# Patient Record
Sex: Male | Born: 1965 | Race: White | Hispanic: No | Marital: Married | Smoking: Current every day smoker
Health system: Southern US, Community
[De-identification: ages and names within clinical notes are randomized; demographics above are authoritative.]

## PROBLEM LIST (undated history)

## (undated) DIAGNOSIS — Z87442 Personal history of urinary calculi: Secondary | ICD-10-CM

## (undated) DIAGNOSIS — K219 Gastro-esophageal reflux disease without esophagitis: Secondary | ICD-10-CM

## (undated) DIAGNOSIS — Q631 Lobulated, fused and horseshoe kidney: Secondary | ICD-10-CM

## (undated) DIAGNOSIS — R519 Headache, unspecified: Secondary | ICD-10-CM

## (undated) HISTORY — PX: COLONOSCOPY: SHX174

## (undated) HISTORY — PX: LITHOTRIPSY: SUR834

---

## 1998-11-29 ENCOUNTER — Encounter: Payer: Self-pay | Admitting: Emergency Medicine

## 1998-11-29 ENCOUNTER — Emergency Department (HOSPITAL_COMMUNITY): Admission: EM | Admit: 1998-11-29 | Discharge: 1998-11-29 | Payer: Self-pay | Admitting: Emergency Medicine

## 2000-01-06 ENCOUNTER — Emergency Department (HOSPITAL_COMMUNITY): Admission: EM | Admit: 2000-01-06 | Discharge: 2000-01-06 | Payer: Self-pay | Admitting: Emergency Medicine

## 2001-12-30 ENCOUNTER — Emergency Department (HOSPITAL_COMMUNITY): Admission: EM | Admit: 2001-12-30 | Discharge: 2001-12-31 | Payer: Self-pay | Admitting: Emergency Medicine

## 2001-12-30 ENCOUNTER — Encounter: Payer: Self-pay | Admitting: Emergency Medicine

## 2004-12-18 ENCOUNTER — Emergency Department (HOSPITAL_COMMUNITY): Admission: EM | Admit: 2004-12-18 | Discharge: 2004-12-18 | Payer: Self-pay | Admitting: Emergency Medicine

## 2006-03-24 ENCOUNTER — Emergency Department (HOSPITAL_COMMUNITY): Admission: EM | Admit: 2006-03-24 | Discharge: 2006-03-24 | Payer: Self-pay | Admitting: Emergency Medicine

## 2006-06-13 ENCOUNTER — Emergency Department (HOSPITAL_COMMUNITY): Admission: EM | Admit: 2006-06-13 | Discharge: 2006-06-13 | Payer: Self-pay | Admitting: Emergency Medicine

## 2006-07-11 IMAGING — CT CT PELVIS W/O CM
1 series · 15 of 32 positions shown, 19 images · non-contrast
Comparison: 12/30/01.

CLINICAL DATA: Right flank pain.

[Series 2: stone_wo 5.0 b40f st · axial · 0.72mm/px · z∈[-452,-100]mm · 15 of 99 slices shown, 19 images]
[im 7/99  soft-tissue]
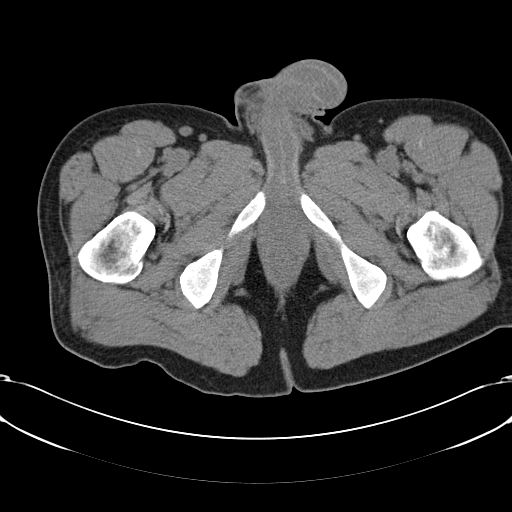
[im 7/99  bone]
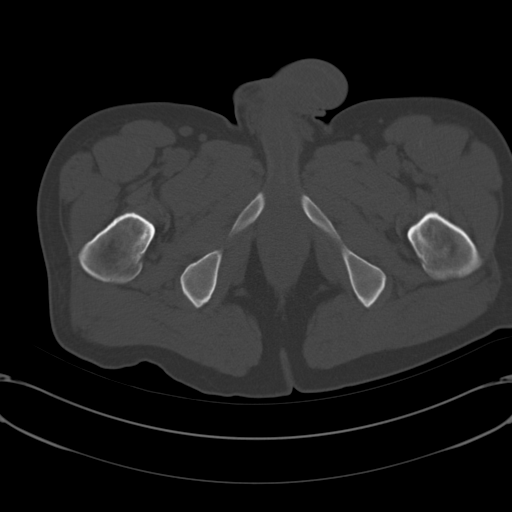
[im 13/99  soft-tissue]
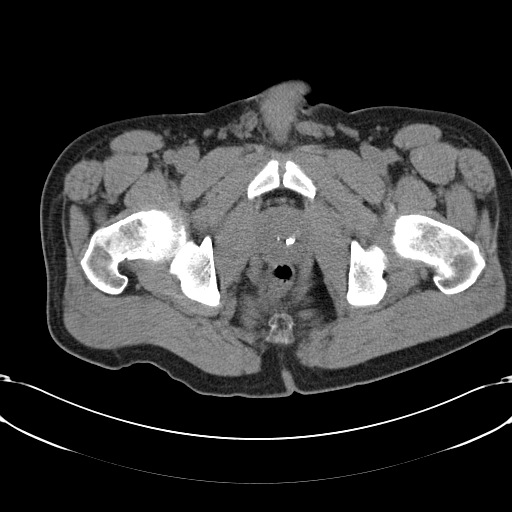
[im 19/99  soft-tissue]
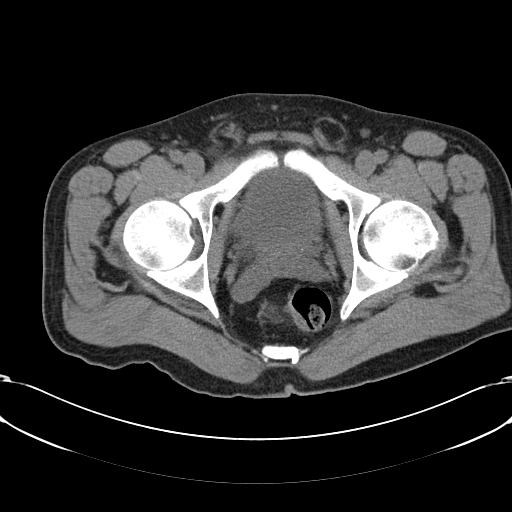
[im 29/99  soft-tissue]
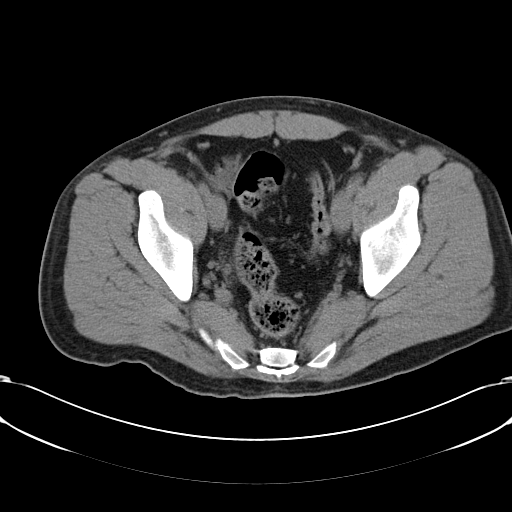
[im 35/99  soft-tissue]
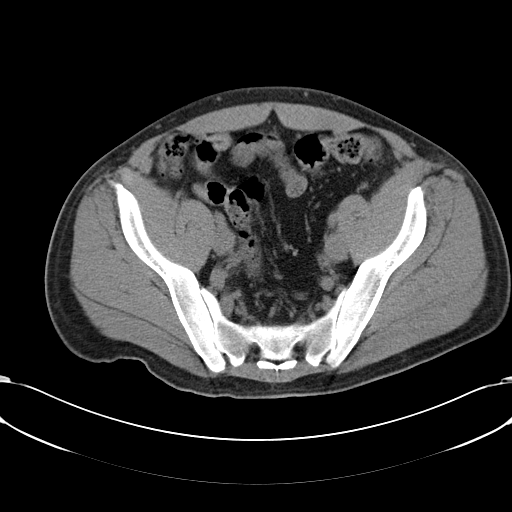
[im 42/99  soft-tissue]
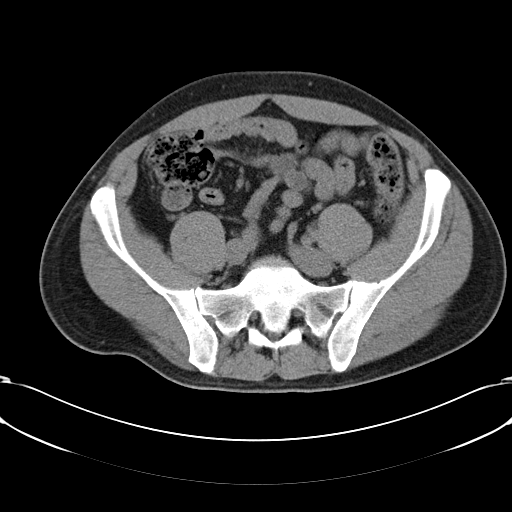
[im 51/99  soft-tissue]
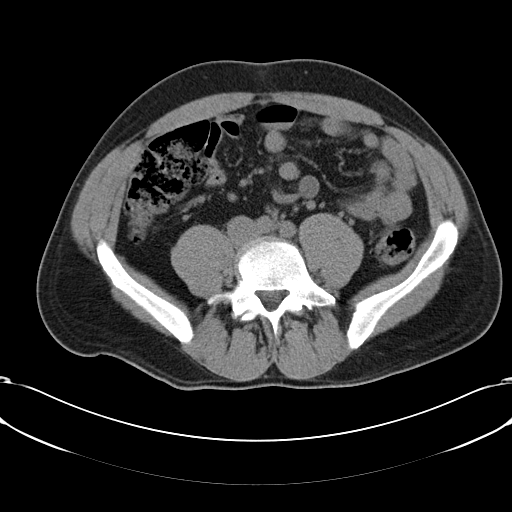
[im 57/99  soft-tissue]
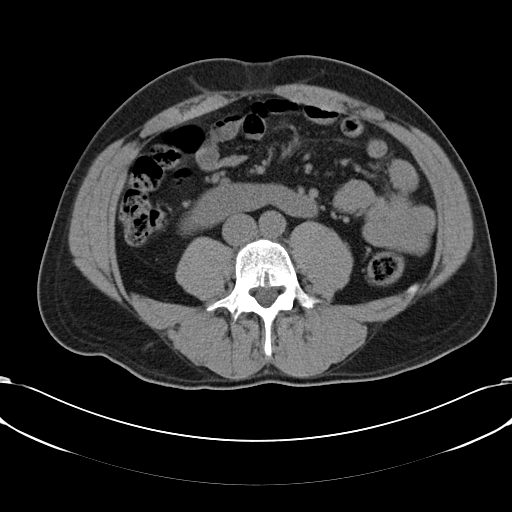
[im 64/99  soft-tissue]
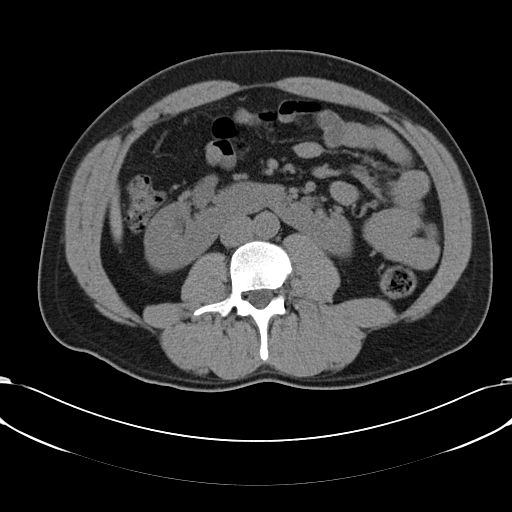
[im 64/99  bone]
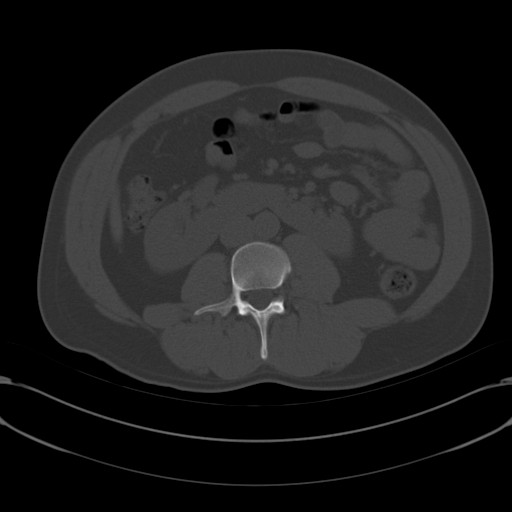
[im 70/99  soft-tissue]
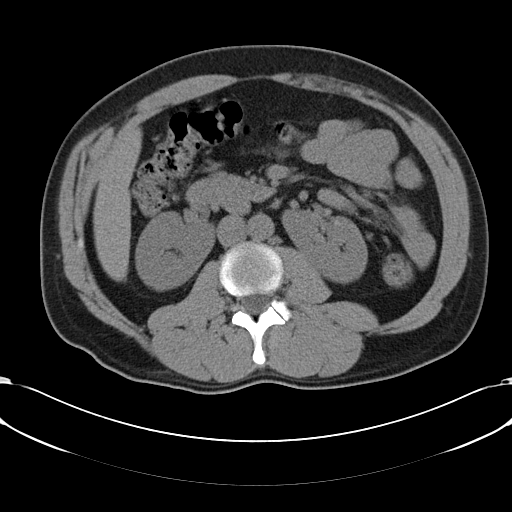
[im 80/99  soft-tissue]
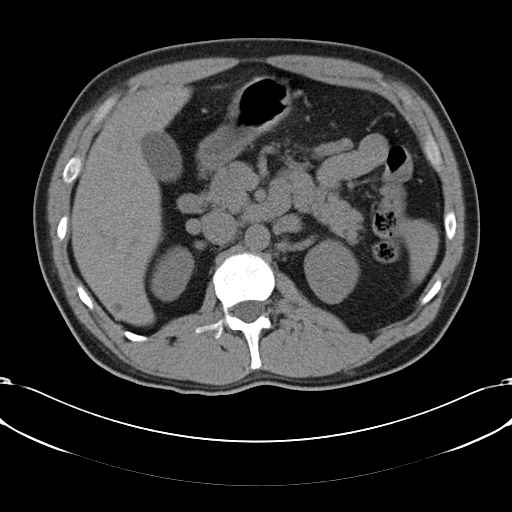
[im 86/99  soft-tissue]
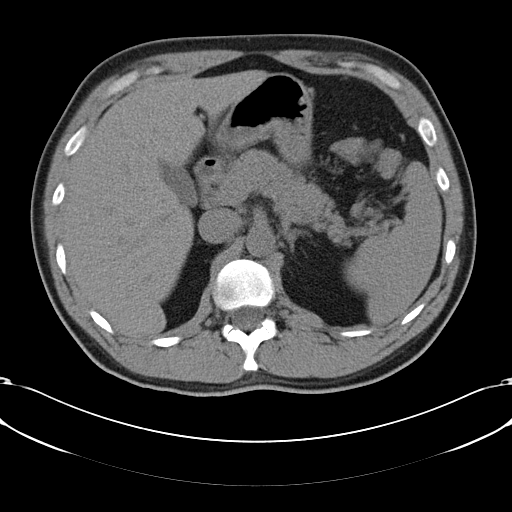
[im 86/99  lung]
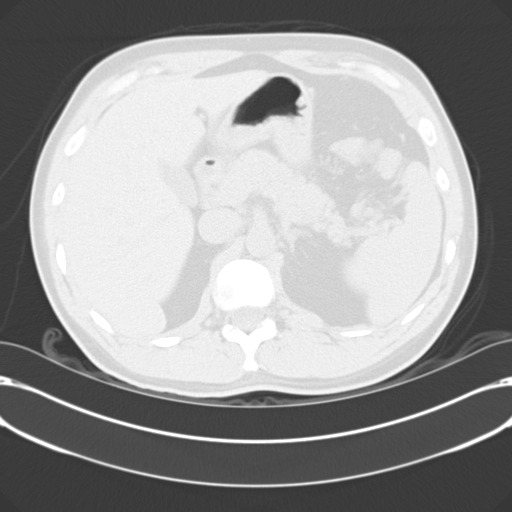
[im 89/99  lung]
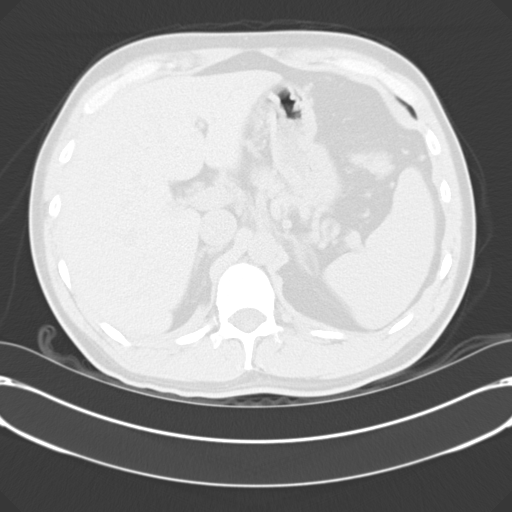
[im 92/99  soft-tissue]
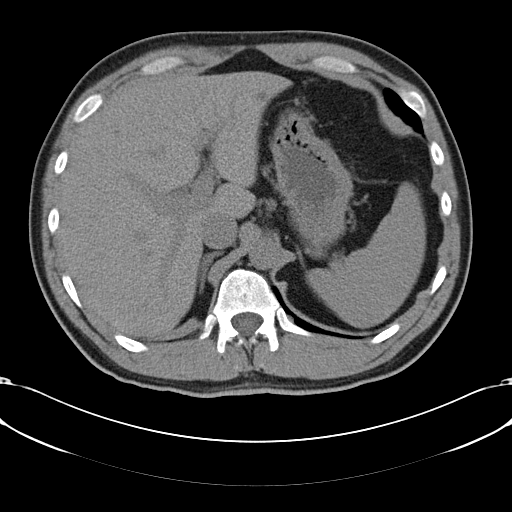
[im 92/99  lung]
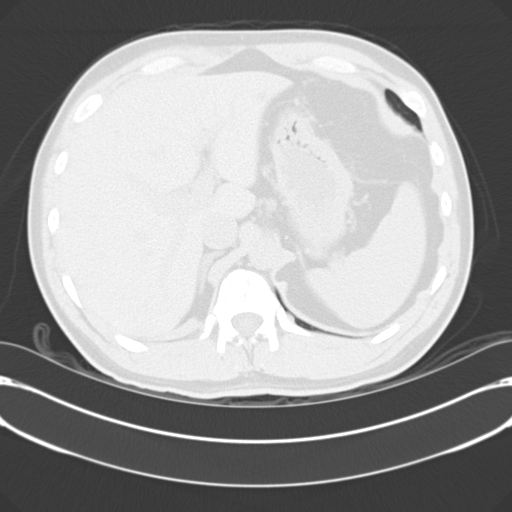
[im 95/99  lung]
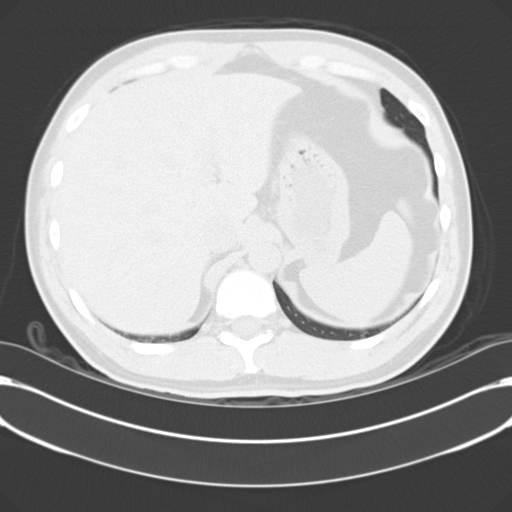

[15 of 32 positions shown; findings below may reference images not displayed]

CT ABDOMEN WITHOUT CONTRAST:
Small low density structures within the liver suggestive of small cysts (inferior right lobe and lateral segment, left lobe).  Unenhanced images of the spleen, adrenal glands, pancreas, gallbladder, and bowel unremarkable.  Scoliosis.  No abdominal aortic aneurysm.  
Horseshoe kidney.  iny bilateral renal calculi.  Mild fullness, right collecting system.  Please see below.
IMPRESSION: Horseshoe kidney with multiple tiny renal calculi.   Mld fullness, right collecting system.  Please see below.
Low density structures in the liver, suggestive of small renal cysts.
CT PELVIS WITHOUT CONTRAST:
Within the distal right ureter, there is a 4.6 mm stone.  Just proximal to this region, there is a tiny renal calculi.  A stone also appears to have been passed and is located within the dependent portion of the bladder, measuring 3.7 mm. There may be a tiny non-obstructing stone along the distal left ureter.  Prostate gland is slightly prominent in size, containing central calcifications.  Mild degenerative changes of lumbar spine.
IMPRESSION: 1.  Distal right ureteral calculi, measuring up to 4.6 mm.  There may be a tiny non-obstructing stone of the distal left ureter.  
2.  Prostate gland is minimally prominent in size containing central calcifications.

## 2006-12-08 ENCOUNTER — Emergency Department (HOSPITAL_COMMUNITY): Admission: EM | Admit: 2006-12-08 | Discharge: 2006-12-08 | Payer: Self-pay | Admitting: Emergency Medicine

## 2019-06-28 DIAGNOSIS — R1084 Generalized abdominal pain: Secondary | ICD-10-CM | POA: Diagnosis not present

## 2019-06-28 DIAGNOSIS — K625 Hemorrhage of anus and rectum: Secondary | ICD-10-CM | POA: Diagnosis not present

## 2019-07-05 DIAGNOSIS — K625 Hemorrhage of anus and rectum: Secondary | ICD-10-CM | POA: Diagnosis not present

## 2019-08-11 DIAGNOSIS — Z1159 Encounter for screening for other viral diseases: Secondary | ICD-10-CM | POA: Diagnosis not present

## 2019-08-16 DIAGNOSIS — K625 Hemorrhage of anus and rectum: Secondary | ICD-10-CM | POA: Diagnosis not present

## 2019-08-16 DIAGNOSIS — K621 Rectal polyp: Secondary | ICD-10-CM | POA: Diagnosis not present

## 2019-08-16 DIAGNOSIS — K635 Polyp of colon: Secondary | ICD-10-CM | POA: Diagnosis not present

## 2019-08-16 DIAGNOSIS — K6289 Other specified diseases of anus and rectum: Secondary | ICD-10-CM | POA: Diagnosis not present

## 2019-08-16 DIAGNOSIS — D123 Benign neoplasm of transverse colon: Secondary | ICD-10-CM | POA: Diagnosis not present

## 2019-08-16 DIAGNOSIS — K529 Noninfective gastroenteritis and colitis, unspecified: Secondary | ICD-10-CM | POA: Diagnosis not present

## 2019-08-24 DIAGNOSIS — K625 Hemorrhage of anus and rectum: Secondary | ICD-10-CM | POA: Diagnosis not present

## 2019-08-26 DIAGNOSIS — K625 Hemorrhage of anus and rectum: Secondary | ICD-10-CM | POA: Diagnosis not present

## 2020-04-06 DIAGNOSIS — F1120 Opioid dependence, uncomplicated: Secondary | ICD-10-CM | POA: Diagnosis not present

## 2020-05-18 DIAGNOSIS — F1120 Opioid dependence, uncomplicated: Secondary | ICD-10-CM | POA: Diagnosis not present

## 2020-06-05 DIAGNOSIS — L720 Epidermal cyst: Secondary | ICD-10-CM | POA: Diagnosis not present

## 2020-06-21 DIAGNOSIS — F1120 Opioid dependence, uncomplicated: Secondary | ICD-10-CM | POA: Diagnosis not present

## 2020-07-13 DIAGNOSIS — F1120 Opioid dependence, uncomplicated: Secondary | ICD-10-CM | POA: Diagnosis not present

## 2020-09-07 DIAGNOSIS — F1120 Opioid dependence, uncomplicated: Secondary | ICD-10-CM | POA: Diagnosis not present

## 2020-09-28 DIAGNOSIS — F1120 Opioid dependence, uncomplicated: Secondary | ICD-10-CM | POA: Diagnosis not present

## 2021-08-31 NOTE — Progress Notes (Signed)
Sent message, via epic in basket, requesting orders in epic from surgeon.  

## 2021-09-13 NOTE — Patient Instructions (Addendum)
DUE TO COVID-19 ONLY ONE VISITOR IS ALLOWED TO COME WITH YOU AND STAY IN THE WAITING ROOM ONLY DURING PRE OP AND PROCEDURE.   **NO VISITORS ARE ALLOWED IN THE SHORT STAY AREA OR RECOVERY ROOM!!**  IF YOU WILL BE ADMITTED INTO THE HOSPITAL YOU ARE ALLOWED ONLY TWO SUPPORT PEOPLE DURING VISITATION HOURS ONLY (7 AM -8PM)   The support person(s) must pass our screening, gel in and out, and wear a mask at all times, including in the patients room. Patients must also wear a mask when staff or their support person are in the room. Visitors GUEST BADGE MUST BE WORN VISIBLY  One adult visitor may remain with you overnight and MUST be in the room by 8 P.M.  No visitors under the age of 51. Any visitor under the age of 79 must be accompanied by an adult.        Your procedure is scheduled on: 09/21/21   Report to [facility] Main Entrance    Report to admitting at : 8:15 AM   Call this number if you have problems the morning of surgery [phone]   Do not eat food :After Midnight.   May have liquids until: 7:30 AM    day of surgery  CLEAR LIQUID DIET  Foods Allowed                                                                     Foods Excluded  Water, Black Coffee and tea, regular and decaf                             liquids that you cannot  Plain Jell-O in any flavor  (No red)                                           see through such as: Fruit ices (not with fruit pulp)                                     milk, soups, orange juice              Iced Popsicles (No red)                                    All solid food                                   Apple juices Sports drinks like Gatorade (No red) Lightly seasoned clear broth or consume(fat free) Sugar Sample Menu Breakfast                                Lunch  Supper Cranberry juice                    Beef broth                            Chicken broth Jell-O                                      Grape juice                           Apple juice Coffee or tea                        Jell-O                                      Popsicle                                                Coffee or tea                        Coffee or tea   FOLLOW BOWEL PREP AND ANY ADDITIONAL PRE OP INSTRUCTIONS YOU RECEIVED FROM YOUR SURGEON'S OFFICE!!!   Oral Hygiene is also important to reduce your risk of infection.                                    Remember - BRUSH YOUR TEETH THE MORNING OF SURGERY WITH YOUR REGULAR TOOTHPASTE   Do NOT smoke after Midnight   Take these medicines the morning of surgery with A SIP OF WATER: N/A  DO NOT TAKE ANY ORAL DIABETIC MEDICATIONS DAY OF YOUR SURGERY                              You may not have any metal on your body including hair pins, jewelry, and body piercing             Do not wear  lotions, powders, perfumes/cologne, or deodorant              Men may shave face and neck.   Do not bring valuables to the hospital. [facility] IS NOT             RESPONSIBLE   FOR VALUABLES.   Contacts, dentures or bridgework may not be worn into surgery.   Bring small overnight bag day of surgery.    Patients discharged on the day of surgery will not be allowed to drive home.  Someone needs to stay with you for the first 24 hours after anesthesia.   Special Instructions: Bring a copy of your healthcare power of attorney and living will documents         the day of surgery if you haven't scanned them before.              Please read over the following fact sheets you were given: IF  YOU HAVE QUESTIONS ABOUT YOUR PRE-OP INSTRUCTIONS PLEASE CALL [phone]     [facility] - Preparing for Surgery Before surgery, you can play an important role.  Because skin is not sterile, your skin needs to be as free of germs as possible.  You can reduce the number of germs on your skin by washing with CHG (chlorahexidine gluconate) soap before surgery.  CHG is an antiseptic  cleaner which kills germs and bonds with the skin to continue killing germs even after washing. Please DO NOT use if you have an allergy to CHG or antibacterial soaps.  If your skin becomes reddened/irritated stop using the CHG and inform your nurse when you arrive at Short Stay. Do not shave (including legs and underarms) for at least 48 hours prior to the first CHG shower.  You may shave your face/neck. Please follow these instructions carefully:  1.  Shower with CHG Soap the night before surgery and the  morning of Surgery.  2.  If you choose to wash your hair, wash your hair first as usual with your  normal  shampoo.  3.  After you shampoo, rinse your hair and body thoroughly to remove the  shampoo.                           4.  Use CHG as you would any other liquid soap.  You can apply chg directly  to the skin and wash                       Gently with a scrungie or clean washcloth.  5.  Apply the CHG Soap to your body ONLY FROM THE NECK DOWN.   Do not use on face/ open                           Wound or open sores. Avoid contact with eyes, ears mouth and genitals (private parts).                       Wash face,  Genitals (private parts) with your normal soap.             6.  Wash thoroughly, paying special attention to the area where your surgery  will be performed.  7.  Thoroughly rinse your body with warm water from the neck down.  8.  DO NOT shower/wash with your normal soap after using and rinsing off  the CHG Soap.                9.  Pat yourself dry with a clean towel.            10.  Wear clean pajamas.            11.  Place clean sheets on your bed the night of your first shower and do not  sleep with pets. Day of Surgery : Do not apply any lotions/deodorants the morning of surgery.  Please wear clean clothes to the hospital/surgery center.  FAILURE TO FOLLOW THESE INSTRUCTIONS MAY RESULT IN THE CANCELLATION OF YOUR SURGERY PATIENT  SIGNATURE_________________________________  NURSE SIGNATURE__________________________________  ________________________________________________________________________

## 2021-09-14 ENCOUNTER — Other Ambulatory Visit: Payer: Self-pay

## 2021-09-14 ENCOUNTER — Encounter (HOSPITAL_COMMUNITY): Payer: Self-pay

## 2021-09-14 ENCOUNTER — Encounter (HOSPITAL_COMMUNITY)
Admission: RE | Admit: 2021-09-14 | Discharge: 2021-09-14 | Disposition: A | Discharge status: Home / Self Care | Payer: 59 | Source: Ambulatory Visit | Attending: Surgery | Admitting: Surgery

## 2021-09-14 VITALS — BP 154/103 | HR 89 | Temp 97.8°F | Ht 70.0 in | Wt 215.0 lb

## 2021-09-14 DIAGNOSIS — Z01818 Encounter for other preprocedural examination: Secondary | ICD-10-CM

## 2021-09-14 DIAGNOSIS — Z01812 Encounter for preprocedural laboratory examination: Secondary | ICD-10-CM | POA: Insufficient documentation

## 2021-09-14 HISTORY — DX: Personal history of urinary calculi: Z87.442

## 2021-09-14 LAB — CBC
HCT: 48.9 % (ref 39.0–52.0)
Hemoglobin: 16.1 g/dL (ref 13.0–17.0)
MCH: 30.2 pg (ref 26.0–34.0)
MCHC: 32.9 g/dL (ref 30.0–36.0)
MCV: 91.7 fL (ref 80.0–100.0)
Platelets: 251 10*3/uL (ref 150–400)
RBC: 5.33 MIL/uL (ref 4.22–5.81)
RDW: 13 % (ref 11.5–15.5)
WBC: 7.7 10*3/uL (ref 4.0–10.5)
nRBC: 0 % (ref 0.0–0.2)

## 2021-09-14 NOTE — Progress Notes (Signed)
COVID Vaccine Completed: NO Date COVID Vaccine completed: COVID vaccine manufacturer: Pfizer    [employer]  COVID Test: N/A Bowel prep reminder: N/A  PCP - NO PCP Cardiologist -   Chest x-ray -  EKG -  Stress Test -  ECHO -  Cardiac Cath -  Pacemaker/ICD device last checked:  Sleep Study -  CPAP -   Fasting Blood Sugar -  Checks Blood Sugar _____ times a day  Blood Thinner Instructions: Aspirin Instructions: Last Dose:  Anesthesia review:   Patient denies shortness of breath, fever, cough and chest pain at PAT appointment   Patient verbalized understanding of instructions that were given to them at the PAT appointment. Patient was also instructed that they will need to review over the PAT instructions again at home before surgery.

## 2021-09-20 NOTE — H&P (Signed)
MRN: [id number] DOB: June 19, 1966   Chief Complaint: left inguinal and umbilical Hernia   History of Present Illness:.   Travis Wilkins has had a left inguinal hernia for several months. He has managed to with a hernia belt. He works at [employer] and does a lot of heavy lifting. We talked about various devices to support that. He also has a small umbilical hernia.  Informed consent was obtained regarding robotic as well as open left inguinal hernia repair  Review of Systems: See HPI as well for other ROS.  ROS   Medical History: Past Medical History:  Diagnosis Date   GERD (gastroesophageal reflux disease)   Substance abuse (CMS-HCC)     Past Surgical History:  Procedure Laterality Date   kidney stone surgery    No Known Allergies  No current outpatient medications on file prior to visit.   No current facility-administered medications on file prior to visit.   History reviewed. No pertinent family history.   Social History   Tobacco Use  Smoking Status Never  Smokeless Tobacco Never    Social History   Socioeconomic History   Marital status: Unknown  Tobacco Use   Smoking status: Never   Smokeless tobacco: Never  Vaping Use   Vaping Use: Never used  Substance and Sexual Activity   Alcohol use: Yes   Drug use: Never   Objective:   Vitals:  BP: 122/80  Pulse: 104  Temp: 37.1 C (98.7 F)  SpO2: 98%  Weight: 97.7 kg (215 lb 6.4 oz)  Height: 177.8 cm (5\' 10" )   Body mass index is 30.91 kg/m.  Physical Exam General: Well-developed white male no acute distress HEENT : Unremarkable Chest: Clear Heart: Sinus rhythm Breast: Not examined Abdomen: Umbilical outie which is small but reducible GU Left inguinal hernia reducible Rectal Not done Extremities FROM Neuro Alert and oriented x 3  Labs, Imaging and Diagnostic Testing: None to reviewa  Assessment and Plan:  Diagnoses and all orders for this visit:  Left inguinal hernia and small  umbilical hernia    I have described robotic left inguinal hernia repair with him probably with the umbilical hernia at the same time. Because he has had a history of substance abuse in the past he does not want any pain medications so we will try to get him through this with a regional block with bupivacaine at the time of his surgery and with ibuprofen.  Arjun Hard , MD

## 2021-09-21 ENCOUNTER — Encounter (HOSPITAL_COMMUNITY)
Admission: RE | Disposition: A | Discharge status: Home / Self Care | Payer: Self-pay | Source: Ambulatory Visit | Attending: Surgery

## 2021-09-21 ENCOUNTER — Other Ambulatory Visit: Payer: Self-pay

## 2021-09-21 ENCOUNTER — Ambulatory Visit (HOSPITAL_COMMUNITY): Payer: 59

## 2021-09-21 ENCOUNTER — Ambulatory Visit (HOSPITAL_BASED_OUTPATIENT_CLINIC_OR_DEPARTMENT_OTHER): Payer: 59 | Admitting: Certified Registered Nurse Anesthetist

## 2021-09-21 ENCOUNTER — Encounter (HOSPITAL_COMMUNITY): Payer: Self-pay | Admitting: Surgery

## 2021-09-21 ENCOUNTER — Ambulatory Visit (HOSPITAL_COMMUNITY): Payer: 59 | Admitting: Certified Registered Nurse Anesthetist

## 2021-09-21 ENCOUNTER — Ambulatory Visit (HOSPITAL_COMMUNITY)
Admission: RE | Admit: 2021-09-21 | Discharge: 2021-09-21 | Disposition: A | Discharge status: Home / Self Care | Payer: 59 | Source: Ambulatory Visit | Attending: Surgery | Admitting: Surgery

## 2021-09-21 DIAGNOSIS — K4090 Unilateral inguinal hernia, without obstruction or gangrene, not specified as recurrent: Secondary | ICD-10-CM | POA: Diagnosis present

## 2021-09-21 DIAGNOSIS — F17210 Nicotine dependence, cigarettes, uncomplicated: Secondary | ICD-10-CM | POA: Diagnosis not present

## 2021-09-21 DIAGNOSIS — K8020 Calculus of gallbladder without cholecystitis without obstruction: Secondary | ICD-10-CM

## 2021-09-21 DIAGNOSIS — K429 Umbilical hernia without obstruction or gangrene: Secondary | ICD-10-CM | POA: Insufficient documentation

## 2021-09-21 HISTORY — PX: HERNIA REPAIR: SHX51

## 2021-09-21 SURGERY — HERNIORRHAPHY, INGUINAL, ROBOT-ASSISTED, LAPAROSCOPIC
Anesthesia: General | Laterality: Left

## 2021-09-21 MED ORDER — KETOROLAC TROMETHAMINE 30 MG/ML IJ SOLN
INTRAMUSCULAR | Status: DC | PRN
  Administered 2021-09-21: 30 mg via INTRAVENOUS

## 2021-09-21 MED ORDER — LIDOCAINE HCL (CARDIAC) PF 100 MG/5ML IV SOSY
PREFILLED_SYRINGE | INTRAVENOUS | Status: DC | PRN
  Administered 2021-09-21: 100 mg via INTRAVENOUS

## 2021-09-21 MED ORDER — IBUPROFEN 800 MG PO TABS: 800.0000 mg | ORAL_TABLET | Freq: Three times a day (TID) | ORAL | 0 refills | Status: AC | PRN

## 2021-09-21 MED ORDER — OXYCODONE HCL 5 MG/5ML PO SOLN: 5.0000 mg | Freq: Once | ORAL | Status: DC | PRN

## 2021-09-21 MED ORDER — CHLORHEXIDINE GLUCONATE CLOTH 2 % EX PADS: 6.0000 | MEDICATED_PAD | Freq: Once | CUTANEOUS | Status: DC

## 2021-09-21 MED ORDER — ACETAMINOPHEN 10 MG/ML IV SOLN: 1000.0000 mg | Freq: Once | INTRAVENOUS | Status: DC | PRN

## 2021-09-21 MED ORDER — SODIUM CHLORIDE (PF) 0.9 % IJ SOLN
INTRAMUSCULAR | Status: DC | PRN
  Administered 2021-09-21: 10 mL

## 2021-09-21 MED ORDER — CEFAZOLIN SODIUM-DEXTROSE 2-4 GM/100ML-% IV SOLN
2.0000 g | INTRAVENOUS | Status: AC
  Administered 2021-09-21: 2 g via INTRAVENOUS
  Filled 2021-09-21: qty 100

## 2021-09-21 MED ORDER — KETOROLAC TROMETHAMINE 30 MG/ML IJ SOLN
INTRAMUSCULAR | Status: AC
  Filled 2021-09-21: qty 1

## 2021-09-21 MED ORDER — ACETAMINOPHEN 160 MG/5ML PO SOLN: 1000.0000 mg | Freq: Once | ORAL | Status: DC | PRN

## 2021-09-21 MED ORDER — PROPOFOL 10 MG/ML IV BOLUS
INTRAVENOUS | Status: DC | PRN
  Administered 2021-09-21: 160 mg via INTRAVENOUS

## 2021-09-21 MED ORDER — ACETAMINOPHEN 500 MG PO TABS
1000.0000 mg | ORAL_TABLET | ORAL | Status: AC
  Administered 2021-09-21: 1000 mg via ORAL
  Filled 2021-09-21: qty 2

## 2021-09-21 MED ORDER — PHENYLEPHRINE HCL (PRESSORS) 10 MG/ML IV SOLN
INTRAVENOUS | Status: AC
  Filled 2021-09-21: qty 1

## 2021-09-21 MED ORDER — ROCURONIUM BROMIDE 100 MG/10ML IV SOLN
INTRAVENOUS | Status: DC | PRN
  Administered 2021-09-21: 30 mg via INTRAVENOUS
  Administered 2021-09-21: 70 mg via INTRAVENOUS
  Administered 2021-09-21: 20 mg via INTRAVENOUS
  Administered 2021-09-21: 30 mg via INTRAVENOUS
  Administered 2021-09-21: 20 mg via INTRAVENOUS

## 2021-09-21 MED ORDER — LACTATED RINGERS IV SOLN: INTRAVENOUS | Status: DC

## 2021-09-21 MED ORDER — KETAMINE HCL 50 MG/5ML IJ SOSY
PREFILLED_SYRINGE | INTRAMUSCULAR | Status: AC
  Filled 2021-09-21: qty 5

## 2021-09-21 MED ORDER — LIDOCAINE HCL (PF) 2 % IJ SOLN
INTRAMUSCULAR | Status: AC
  Filled 2021-09-21: qty 5

## 2021-09-21 MED ORDER — FENTANYL CITRATE (PF) 100 MCG/2ML IJ SOLN
INTRAMUSCULAR | Status: AC
  Filled 2021-09-21: qty 2

## 2021-09-21 MED ORDER — PROPOFOL 10 MG/ML IV BOLUS
INTRAVENOUS | Status: AC
  Filled 2021-09-21: qty 20

## 2021-09-21 MED ORDER — CHLORHEXIDINE GLUCONATE 0.12 % MT SOLN
15.0000 mL | Freq: Once | OROMUCOSAL | Status: AC
  Administered 2021-09-21: 15 mL via OROMUCOSAL

## 2021-09-21 MED ORDER — ONDANSETRON HCL 4 MG/2ML IJ SOLN
INTRAMUSCULAR | Status: AC
  Filled 2021-09-21: qty 2

## 2021-09-21 MED ORDER — ONDANSETRON HCL 4 MG/2ML IJ SOLN
INTRAMUSCULAR | Status: DC | PRN
  Administered 2021-09-21: 4 mg via INTRAVENOUS

## 2021-09-21 MED ORDER — SODIUM CHLORIDE (PF) 0.9 % IJ SOLN
INTRAMUSCULAR | Status: AC
  Filled 2021-09-21: qty 10

## 2021-09-21 MED ORDER — BUPIVACAINE LIPOSOME 1.3 % IJ SUSP
INTRAMUSCULAR | Status: DC | PRN
Start: 2021-09-21 — End: 2021-09-21
  Administered 2021-09-21: 20 mL

## 2021-09-21 MED ORDER — ORAL CARE MOUTH RINSE: 15.0000 mL | Freq: Once | OROMUCOSAL | Status: AC

## 2021-09-21 MED ORDER — MIDAZOLAM HCL 2 MG/2ML IJ SOLN
INTRAMUSCULAR | Status: AC
  Filled 2021-09-21: qty 2

## 2021-09-21 MED ORDER — ACETAMINOPHEN 500 MG PO TABS: 1000.0000 mg | ORAL_TABLET | Freq: Once | ORAL | Status: DC | PRN

## 2021-09-21 MED ORDER — ROCURONIUM BROMIDE 10 MG/ML (PF) SYRINGE
PREFILLED_SYRINGE | INTRAVENOUS | Status: AC
  Filled 2021-09-21: qty 10

## 2021-09-21 MED ORDER — DEXAMETHASONE SODIUM PHOSPHATE 10 MG/ML IJ SOLN
INTRAMUSCULAR | Status: DC | PRN
  Administered 2021-09-21: 10 mg via INTRAVENOUS

## 2021-09-21 MED ORDER — BUPIVACAINE LIPOSOME 1.3 % IJ SUSP
20.0000 mL | Freq: Once | INTRAMUSCULAR | Status: DC
  Filled 2021-09-21: qty 20

## 2021-09-21 MED ORDER — KETAMINE HCL 10 MG/ML IJ SOLN
INTRAMUSCULAR | Status: DC | PRN
  Administered 2021-09-21 (×2): 10 mg via INTRAVENOUS
  Administered 2021-09-21: 30 mg via INTRAVENOUS

## 2021-09-21 MED ORDER — FENTANYL CITRATE PF 50 MCG/ML IJ SOSY
25.0000 ug | PREFILLED_SYRINGE | INTRAMUSCULAR | Status: DC | PRN
  Administered 2021-09-21: 50 ug via INTRAVENOUS

## 2021-09-21 MED ORDER — FENTANYL CITRATE (PF) 100 MCG/2ML IJ SOLN
INTRAMUSCULAR | Status: DC | PRN
  Administered 2021-09-21 (×2): 100 ug via INTRAVENOUS

## 2021-09-21 MED ORDER — PHENYLEPHRINE 40 MCG/ML (10ML) SYRINGE FOR IV PUSH (FOR BLOOD PRESSURE SUPPORT)
PREFILLED_SYRINGE | INTRAVENOUS | Status: DC | PRN
  Administered 2021-09-21 (×2): 80 ug via INTRAVENOUS
  Administered 2021-09-21: 40 ug via INTRAVENOUS
  Administered 2021-09-21 (×2): 80 ug via INTRAVENOUS

## 2021-09-21 MED ORDER — MIDAZOLAM HCL 5 MG/5ML IJ SOLN
INTRAMUSCULAR | Status: DC | PRN
  Administered 2021-09-21: 2 mg via INTRAVENOUS

## 2021-09-21 MED ORDER — FENTANYL CITRATE PF 50 MCG/ML IJ SOSY
PREFILLED_SYRINGE | INTRAMUSCULAR | Status: AC
  Filled 2021-09-21: qty 3

## 2021-09-21 MED ORDER — BUPIVACAINE LIPOSOME 1.3 % IJ SUSP
INTRAMUSCULAR | Status: AC
  Filled 2021-09-21: qty 20

## 2021-09-21 MED ORDER — SUGAMMADEX SODIUM 200 MG/2ML IV SOLN
INTRAVENOUS | Status: DC | PRN
  Administered 2021-09-21: 200 mg via INTRAVENOUS

## 2021-09-21 MED ORDER — PHENYLEPHRINE HCL-NACL 20-0.9 MG/250ML-% IV SOLN
INTRAVENOUS | Status: DC | PRN
  Administered 2021-09-21: 40 ug/min via INTRAVENOUS

## 2021-09-21 MED ORDER — OXYCODONE HCL 5 MG PO TABS: 5.0000 mg | ORAL_TABLET | Freq: Once | ORAL | Status: DC | PRN

## 2021-09-21 SURGICAL SUPPLY — 64 items
ADH SKN CLS APL DERMABOND .7 (GAUZE/BANDAGES/DRESSINGS) ×1
APL PRP STRL LF DISP 70% ISPRP (MISCELLANEOUS) ×1
APL SWBSTK 6 STRL LF DISP (MISCELLANEOUS)
APPLICATOR COTTON TIP 6 STRL (MISCELLANEOUS) ×2 IMPLANT
APPLICATOR COTTON TIP 6IN STRL (MISCELLANEOUS)
BAG COUNTER SPONGE SURGICOUNT (BAG) IMPLANT
BAG SPNG CNTER NS LX DISP (BAG)
BLADE SURG 15 STRL LF DISP TIS (BLADE) ×1 IMPLANT
BLADE SURG 15 STRL SS (BLADE) ×2
CANNULA REDUC XI 12-8 STAPL (CANNULA) ×2
CANNULA REDUCER 12-8 DVNC XI (CANNULA) ×1 IMPLANT
CHLORAPREP W/TINT 26 (MISCELLANEOUS) ×2 IMPLANT
COVER MAYO STAND STRL (DRAPES) ×1 IMPLANT
COVER SURGICAL LIGHT HANDLE (MISCELLANEOUS) ×2 IMPLANT
COVER TIP SHEARS 8 DVNC (MISCELLANEOUS) ×1 IMPLANT
COVER TIP SHEARS 8MM DA VINCI (MISCELLANEOUS) ×2
DERMABOND ADVANCED (GAUZE/BANDAGES/DRESSINGS) ×1
DERMABOND ADVANCED .7 DNX12 (GAUZE/BANDAGES/DRESSINGS) IMPLANT
DRAPE ARM DVNC X/XI (DISPOSABLE) ×3 IMPLANT
DRAPE COLUMN DVNC XI (DISPOSABLE) ×1 IMPLANT
DRAPE DA VINCI XI ARM (DISPOSABLE) ×8
DRAPE DA VINCI XI COLUMN (DISPOSABLE) ×2
ELECT REM PT RETURN 15FT ADLT (MISCELLANEOUS) ×2 IMPLANT
GLOVE SURG ENC TEXT LTX SZ8 (GLOVE) ×4 IMPLANT
GOWN STRL REUS W/TWL XL LVL3 (GOWN DISPOSABLE) ×6 IMPLANT
GRASPER SUT TROCAR 14GX15 (MISCELLANEOUS) ×2 IMPLANT
IRRIG SUCT STRYKERFLOW 2 WTIP (MISCELLANEOUS)
IRRIGATION SUCT STRKRFLW 2 WTP (MISCELLANEOUS) ×1 IMPLANT
KIT BASIN OR (CUSTOM PROCEDURE TRAY) ×2 IMPLANT
KIT TURNOVER KIT A (KITS) IMPLANT
MARKER SKIN DUAL TIP RULER LAB (MISCELLANEOUS) ×2 IMPLANT
MESH 3DMAX 4X6 LT LRG (Mesh General) ×1 IMPLANT
NEEDLE HYPO 22GX1.5 SAFETY (NEEDLE) ×2 IMPLANT
NS IRRIG 1000ML POUR BTL (IV SOLUTION) ×1 IMPLANT
OBTURATOR OPTICAL STANDARD 8MM (TROCAR) ×2
OBTURATOR OPTICAL STND 8 DVNC (TROCAR) ×1
OBTURATOR OPTICALSTD 8 DVNC (TROCAR) ×1 IMPLANT
PACK CARDIOVASCULAR III (CUSTOM PROCEDURE TRAY) ×2 IMPLANT
PAD POSITIONING PINK XL (MISCELLANEOUS) ×2 IMPLANT
SCISSORS LAP 5X35 DISP (ENDOMECHANICALS) IMPLANT
SEAL CANN UNIV 5-8 DVNC XI (MISCELLANEOUS) ×2 IMPLANT
SEAL XI 5MM-8MM UNIVERSAL (MISCELLANEOUS) ×6
SOL ANTI FOG 6CC (MISCELLANEOUS) ×1 IMPLANT
SOLUTION ANTI FOG 6CC (MISCELLANEOUS) ×1
SOLUTION ELECTROLUBE (MISCELLANEOUS) ×2 IMPLANT
SPIKE FLUID TRANSFER (MISCELLANEOUS) ×1 IMPLANT
SPONGE T-LAP 18X18 ~~LOC~~+RFID (SPONGE) ×2 IMPLANT
STAPLER CANNULA SEAL DVNC XI (STAPLE) ×1 IMPLANT
STAPLER CANNULA SEAL XI (STAPLE) ×2
SUT MNCRL AB 4-0 PS2 18 (SUTURE) ×4 IMPLANT
SUT V-LOC BARB 180 2/0GR6 GS22 (SUTURE) ×2
SUT VIC AB 2-0 SH 27 (SUTURE) ×2
SUT VIC AB 2-0 SH 27X BRD (SUTURE) ×1 IMPLANT
SUT VICRYL 0 TIES 12 18 (SUTURE) ×2 IMPLANT
SUT VLOC 180 2-0 6IN GS21 (SUTURE) ×1 IMPLANT
SUTURE V-LC BRB 180 2/0GR6GS22 (SUTURE) IMPLANT
SYR 10ML ECCENTRIC (SYRINGE) ×2 IMPLANT
SYR 20ML LL LF (SYRINGE) ×2 IMPLANT
TAPE STRIPS DRAPE STRL (GAUZE/BANDAGES/DRESSINGS) ×2 IMPLANT
TOWEL OR 17X26 10 PK STRL BLUE (TOWEL DISPOSABLE) ×2 IMPLANT
TOWEL OR NON WOVEN STRL DISP B (DISPOSABLE) ×2 IMPLANT
TROCAR ADV FIXATION 12X100MM (TROCAR) IMPLANT
TROCAR BLADELESS OPT 5 100 (ENDOMECHANICALS) ×2 IMPLANT
TUBING INSUFFLATION 10FT LAP (TUBING) ×2 IMPLANT

## 2021-09-21 NOTE — Op Note (Signed)
Travis Wilkins  May 02, 1966   09/21/2021    PCP:  [facility] [place]   Surgeon: Wenda Low, MD, FACS  Asst:  none  Anes:  general  Preop Dx: Left inguinal hernia Postop Dx: Left sliding indirect inguinal hernia  Procedure: Robotic XI LIH repair with Bard 3D Max mesh Location Surgery: [facility] 5 Complications:  none  EBL:   minimal cc  Drains: none  Description of Procedure:  The patient was taken to OR 5 .  After anesthesia was administered and the patient was prepped  with ChloraPrep as well as the scrotum was painted with Betadine and a timeout was performed.  Access to the abdomen was achieved with a 5 mm Optiview through the left upper quadrant.  This was incorporated into my 3 upper abdominal robotic trocars and 2 eights were placed on the left and in the midline slightly to the off and in the right upper quadrant of the 12 mm for mesh exchange and suture.  Robot was docked and camera targeted and we started with a fenestrated bipolar in the left side and the monopolar scissors on the right with the 30 degree angle scope.  The sigmoid colon was adherent to the internal anterior abdominal wall in the vicinity of the of the colon and in portions of the colonic structures were stuck into the hernia sac.  These were taken down with sharp dissections.  I created a flap by going above the defect going medial to lateral and I opened and created flaps in both the parietal and visceral peritoneal layers and carried this down to the hernia sac.  I went down and identified the pubis and then went down to free the sac from the surrounding structures and also to reduce a large lipoma of the cord which was found.  These were brought down.  I went wide and it was well medial and below the pubis.  I sutured up.  Piece of Bard 3D max mesh tacking it medially to the pubis with a 2-0 Vicryl.  I tacked it anteriorly with a 2-0 Vicryl and then it laid out nicely.  The indirect sac  had been stripped off the cord and identified the vas and saw the vessels and those remained intact.  Portion of the sac could have been involved with a slider were somewhat thin and I ended up incising an area to get it off the vas to reduce it better.  Once that was done I was able to then closed the flaps with a running of V-Loc.  There was a little opening down where the hernia sac had been closed with a V-Loc as well.  Colon appeared to be undisturbed and all of this.  Everything went well.  Testicle was in position position and we then repaired the 12 mm port with a single PMI and a 0 Vicryl.  I in the preop moments I had injected the anterior superior iliac spine on the left side with a field block and then injected the trocars with Exparel as well.  Wounds were closed with 4-0 Monocryl and Dermabond.  The patient tolerated the procedure well and was taken to the PACU in stable condition.     Matt B. Daphine Deutscher, MD, [facility], [place] [phone]

## 2021-09-21 NOTE — Transfer of Care (Signed)
Immediate Anesthesia Transfer of Care Note  Patient: Travis Wilkins  Procedure(s) Performed: XI ROBOTIC ASSISTED left INGUINAL HERNIA (Left)  Patient Location: PACU  Anesthesia Type:General  Level of Consciousness: awake, alert  and oriented  Airway & Oxygen Therapy: Patient Spontanous Breathing  Post-op Assessment: Report given to RN and Post -op Vital signs reviewed and stable  Post vital signs: Reviewed and stable  Last Vitals:  Vitals Value Taken Time  BP 134/94 09/21/21 1337  Temp    Pulse 94 09/21/21 1343  Resp 18 09/21/21 1343  SpO2 92 % 09/21/21 1343  Vitals shown include unvalidated device data.  Last Pain:  Vitals:   09/21/21 0911  TempSrc: Oral  PainSc: 0-No pain      Patients Stated Pain Goal: 3 (09/21/21 0911)  Complications: No notable events documented.

## 2021-09-21 NOTE — Anesthesia Preprocedure Evaluation (Signed)
Anesthesia Evaluation  Patient identified by MRN, date of birth, ID band Patient awake    Reviewed: Allergy & Precautions, NPO status , Patient's Chart, lab work & pertinent test results  History of Anesthesia Complications Negative for: history of anesthetic complications  Airway Mallampati: III  TM Distance: >3 FB Neck ROM: Full    Dental  (+) Teeth Intact, Dental Advisory Given   Pulmonary neg shortness of breath, neg COPD, neg recent URI, Current Smoker and Patient abstained from smoking.,    breath sounds clear to auscultation       Cardiovascular negative cardio ROS   Rhythm:Regular     Neuro/Psych negative neurological ROS  negative psych ROS   GI/Hepatic negative GI ROS, Neg liver ROS,   Endo/Other  negative endocrine ROS  Renal/GU negative Renal ROS     Musculoskeletal negative musculoskeletal ROS (+)   Abdominal   Peds  Hematology negative hematology ROS (+)   Anesthesia Other Findings   Reproductive/Obstetrics                             Anesthesia Physical Anesthesia Plan  ASA: 1  Anesthesia Plan: General   Post-op Pain Management: Toradol IV (intra-op)* and Tylenol PO (pre-op)*   Induction: Intravenous  PONV Risk Score and Plan: 1 and Ondansetron and Dexamethasone  Airway Management Planned: Oral ETT  Additional Equipment: None  Intra-op Plan:   Post-operative Plan: Extubation in OR  Informed Consent: I have reviewed the patients History and Physical, chart, labs and discussed the procedure including the risks, benefits and alternatives for the proposed anesthesia with the patient or authorized representative who has indicated his/her understanding and acceptance.     Dental advisory given  Plan Discussed with: CRNA and Anesthesiologist  Anesthesia Plan Comments:         Anesthesia Quick Evaluation

## 2021-09-21 NOTE — Interval H&P Note (Signed)
History and Physical Interval Note:  09/21/2021 9:37 AM  Travis Wilkins  has presented today for surgery, with the diagnosis of left inguinal hernia.  The various methods of treatment have been discussed with the patient and family. After consideration of risks, benefits and other options for treatment, the patient has consented to  Procedure(s): XI ROBOTIC ASSISTED left INGUINAL HERNIA (Left) as a surgical intervention.  The patient's history has been reviewed, patient examined, no change in status, stable for surgery.  I have reviewed the patient's chart and labs.  Questions were answered to the patient's satisfaction.     [name]

## 2021-09-21 NOTE — Anesthesia Procedure Notes (Signed)
Procedure Name: Intubation Date/Time: 09/21/2021 10:06 AM Performed by: [place] Lyana Asbill C, CRNA Pre-anesthesia Checklist: Patient identified, Emergency Drugs available, Suction available and Patient being monitored Patient Re-evaluated:Patient Re-evaluated prior to induction Oxygen Delivery Method: Circle system utilized Preoxygenation: Pre-oxygenation with 100% oxygen Induction Type: IV induction Ventilation: Mask ventilation without difficulty Laryngoscope Size: Mac and 4 Grade View: Grade II Tube type: Oral Tube size: 7.5 mm Number of attempts: 1 Airway Equipment and Method: Stylet and Oral airway Placement Confirmation: ETT inserted through vocal cords under direct vision, positive ETCO2 and breath sounds checked- equal and bilateral Secured at: 22 cm Tube secured with: Tape Dental Injury: Teeth and Oropharynx as per pre-operative assessment

## 2021-09-21 NOTE — Anesthesia Postprocedure Evaluation (Signed)
Anesthesia Post Note  Patient: Travis Wilkins  Procedure(s) Performed: XI ROBOTIC ASSISTED left INGUINAL HERNIA (Left)     Patient location during evaluation: PACU Anesthesia Type: General Level of consciousness: sedated Pain management: pain level controlled Vital Signs Assessment: post-procedure vital signs reviewed and stable Respiratory status: spontaneous breathing and respiratory function stable Cardiovascular status: stable Postop Assessment: no apparent nausea or vomiting Anesthetic complications: no   No notable events documented.  Last Vitals:  Vitals:   09/21/21 1443 09/21/21 1446  BP: (!) 138/99 (!) 134/91  Pulse: 97 92  Resp:    Temp:    SpO2: 96% 98%    Last Pain:  Vitals:   09/21/21 1446  TempSrc:   PainSc: 0-No pain                 Rod Majerus DANIEL

## 2022-01-18 NOTE — Progress Notes (Signed)
Pt. Needs orders for upcoming surgery. ?

## 2022-01-18 NOTE — Patient Instructions (Signed)
DUE TO COVID-19 ONLY TWO VISITORS  (aged 54 and older)  ARE ALLOWED TO COME WITH YOU AND STAY IN THE WAITING ROOM ONLY DURING PRE OP AND PROCEDURE.   **NO VISITORS ARE ALLOWED IN THE SHORT STAY AREA OR RECOVERY ROOM!!**  IF YOU WILL BE ADMITTED INTO THE HOSPITAL YOU ARE ALLOWED ONLY FOUR SUPPORT PEOPLE DURING VISITATION HOURS ONLY (7 AM -8PM)   The support person(s) must pass our screening, gel in and out, and wear a mask at all times, including in the patient's room. Patients must also wear a mask when staff or their support person are in the room. Visitors GUEST BADGE MUST BE WORN VISIBLY  One adult visitor may remain with you overnight and MUST be in the room by 8 P.M.     Your procedure is scheduled on: 01/22/22   Report to [facility] Main Entrance    Report to admitting at: 8:45 AM   Call this number if you have problems the morning of surgery [phone]   Do not eat food :After Midnight.   After Midnight you may have the following liquids until: 8:00 AM DAY OF SURGERY  Water Black Coffee (sugar ok, NO MILK/CREAM OR CREAMERS)  Tea (sugar ok, NO MILK/CREAM OR CREAMERS) regular and decaf                             Plain Jell-O (NO RED)                                           Fruit ices (not with fruit pulp, NO RED)                                     Popsicles (NO RED)                                                                  Juice: apple, WHITE grape, WHITE cranberry Sports drinks like Gatorade (NO RED) Clear broth(vegetable,chicken,beef)   Oral Hygiene is also important to reduce your risk of infection.                                    Remember - BRUSH YOUR TEETH THE MORNING OF SURGERY WITH YOUR REGULAR TOOTHPASTE   Do NOT smoke after Midnight   Take these medicines the morning of surgery with A SIP OF WATER: omeprazole.  DO NOT TAKE ANY ORAL DIABETIC MEDICATIONS DAY OF YOUR SURGERY  Bring CPAP mask and tubing day of surgery.                               You may not have any metal on your body including hair pins, jewelry, and body piercing             Do not wear  lotions, powders, perfumes/cologne, or deodorant  Men may shave face and neck.   Do not bring valuables to the hospital. [place].   Contacts, dentures or bridgework may not be worn into surgery.   Bring small overnight bag day of surgery.   DO NOT [place]. PHARMACY WILL DISPENSE MEDICATIONS LISTED ON YOUR MEDICATION LIST TO YOU DURING YOUR ADMISSION [place]!    Patients discharged on the day of surgery will not be allowed to drive home.  Someone NEEDS to stay with you for the first 24 hours after anesthesia.   Special Instructions: Bring a copy of your healthcare power of attorney and living will documents         the day of surgery if you haven't scanned them before.              Please read over the following fact sheets you were given: IF YOU HAVE QUESTIONS ABOUT YOUR PRE-OP INSTRUCTIONS PLEASE CALL [phone]     [facility] - Preparing for Surgery Before surgery, you can play an important role.  Because skin is not sterile, your skin needs to be as free of germs as possible.  You can reduce the number of germs on your skin by washing with CHG (chlorahexidine gluconate) soap before surgery.  CHG is an antiseptic cleaner which kills germs and bonds with the skin to continue killing germs even after washing. Please DO NOT use if you have an allergy to CHG or antibacterial soaps.  If your skin becomes reddened/irritated stop using the CHG and inform your nurse when you arrive at Short Stay. Do not shave (including legs and underarms) for at least 48 hours prior to the first CHG shower.  You may shave your face/neck. Please follow these instructions carefully:  1.  Shower with CHG Soap the night before surgery and the  morning of Surgery.  2.  If you choose to  wash your hair, wash your hair first as usual with your  normal  shampoo.  3.  After you shampoo, rinse your hair and body thoroughly to remove the  shampoo.                           4.  Use CHG as you would any other liquid soap.  You can apply chg directly  to the skin and wash                       Gently with a scrungie or clean washcloth.  5.  Apply the CHG Soap to your body ONLY FROM THE NECK DOWN.   Do not use on face/ open                           Wound or open sores. Avoid contact with eyes, ears mouth and genitals (private parts).                       Wash face,  Genitals (private parts) with your normal soap.             6.  Wash thoroughly, paying special attention to the area where your surgery  will be performed.  7.  Thoroughly rinse your body with warm water from the neck down.  8.  DO NOT shower/wash with your normal soap after using and rinsing off  the CHG Soap.                9.  Pat yourself dry with a clean towel.            10.  Wear clean pajamas.            11.  Place clean sheets on your bed the night of your first shower and do not  sleep with pets. Day of Surgery : Do not apply any lotions/deodorants the morning of surgery.  Please wear clean clothes to the hospital/surgery center.  FAILURE TO FOLLOW THESE INSTRUCTIONS MAY RESULT IN THE CANCELLATION OF YOUR SURGERY PATIENT SIGNATURE_________________________________  NURSE SIGNATURE__________________________________  ________________________________________________________________________

## 2022-01-20 NOTE — H&P (Signed)
Chief Complaint:  recurrent left inguinal hernia with pain  History of Present Illness:  Travis Wilkins is an 56 y.o. male who had a robotic LIH in February presented with an early recurrent LIH.  I have discussed this with him and the plan is to do laparoscopy and plan for an open left inguinal hernia repair  Past Medical History:  Diagnosis Date   History of kidney stones     Past Surgical History:  Procedure Laterality Date   LITHOTRIPSY      No current facility-administered medications for this encounter.   Current Outpatient Medications  Medication Sig Dispense Refill   diclofenac Sodium (VOLTAREN) 1 % GEL Apply 2 g topically daily as needed (knee pain).     ibuprofen (ADVIL) 200 MG tablet Take 400 mg by mouth every 6 (six) hours as needed for moderate pain or headache.     ibuprofen (ADVIL) 800 MG tablet Take 1 tablet (800 mg total) by mouth every 8 (eight) hours as needed. 30 tablet 0   omeprazole (PRILOSEC OTC) 20 MG tablet Take 20 mg by mouth daily.     Patient has no known allergies. No family history on file. Social History:   reports that he has been smoking cigarettes. He has been smoking an average of .25 packs per day. His smokeless tobacco use includes snuff. He reports current alcohol use. He reports that he does not use drugs.   REVIEW OF SYSTEMS : Negative except for see problem list  Physical Exam:   There were no vitals taken for this visit. There is no height or weight on file to calculate BMI.  Gen:  WDWN WM NAD  Neurological: Alert and oriented to person, place, and time. Motor and sensory function is grossly intact  Head: Normocephalic and atraumatic.  Eyes: Conjunctivae are normal. Pupils are equal, round, and reactive to light. No scleral icterus.  Neck: Normal range of motion. Neck supple. No tracheal deviation or thyromegaly present.  Cardiovascular:  SR without murmurs or gallops.  No carotid bruits Breast:  not examined Respiratory: Effort  normal.  No respiratory distress. No chest wall tenderness. Breath sounds normal.  No wheezes, rales or rhonchi.  Abdomen:  nontender GU:  reducible recurrent left inguinal hernia Musculoskeletal: Normal range of motion. Extremities are nontender. No cyanosis, edema or clubbing noted Lymphadenopathy: No cervical, preauricular, postauricular or axillary adenopathy is present Skin: Skin is warm and dry. No rash noted. No diaphoresis. No erythema. No pallor. Pscyh: Normal mood and affect. Behavior is normal. Judgment and thought content normal.   LABORATORY RESULTS: No results found for this or any previous visit (from the past 48 hour(s)).   RADIOLOGY RESULTS: No results found.  Problem List: There are no problems to display for this patient.   Assessment & Plan: Left inguinal hernia recurrent.  Plan laparoscopy and open repair with mesh    Matt B. Daphine Deutscher, MD, [facility] [phone] beeper [phone]  01/20/2022 6:52 PM    who

## 2022-01-21 ENCOUNTER — Other Ambulatory Visit: Payer: Self-pay

## 2022-01-21 ENCOUNTER — Encounter (HOSPITAL_COMMUNITY): Payer: Self-pay

## 2022-01-21 ENCOUNTER — Encounter (HOSPITAL_COMMUNITY)
Admission: RE | Admit: 2022-01-21 | Discharge: 2022-01-21 | Disposition: A | Discharge status: Home / Self Care | Payer: 59 | Source: Ambulatory Visit | Attending: Surgery | Admitting: Surgery

## 2022-01-21 VITALS — BP 133/86 | HR 75 | Temp 98.7°F | Resp 16 | Ht 70.0 in | Wt 212.0 lb

## 2022-01-21 DIAGNOSIS — Z01818 Encounter for other preprocedural examination: Secondary | ICD-10-CM

## 2022-01-21 DIAGNOSIS — Z01812 Encounter for preprocedural laboratory examination: Secondary | ICD-10-CM | POA: Insufficient documentation

## 2022-01-21 HISTORY — DX: Gastro-esophageal reflux disease without esophagitis: K21.9

## 2022-01-21 HISTORY — DX: Lobulated, fused and horseshoe kidney: Q63.1

## 2022-01-21 LAB — CBC
HCT: 48.6 % (ref 39.0–52.0)
Hemoglobin: 16.1 g/dL (ref 13.0–17.0)
MCH: 30.8 pg (ref 26.0–34.0)
MCHC: 33.1 g/dL (ref 30.0–36.0)
MCV: 92.9 fL (ref 80.0–100.0)
Platelets: 282 10*3/uL (ref 150–400)
RBC: 5.23 MIL/uL (ref 4.22–5.81)
RDW: 13 % (ref 11.5–15.5)
WBC: 11.8 10*3/uL — ABNORMAL HIGH (ref 4.0–10.5)
nRBC: 0 % (ref 0.0–0.2)

## 2022-01-21 NOTE — Progress Notes (Addendum)
  PCP - [facility] [place] hasnt been in a couple years Cardiologist - no  PPM/ICD -  Device Orders -  Rep Notified -   Chest x-ray -  EKG -  Stress Test -  ECHO -  Cardiac Cath -   Sleep Study -  CPAP -   Fasting Blood Sugar -  Checks Blood Sugar _____ times a day  Blood Thinner Instructions: Aspirin Instructions:  ERAS Protcol - PRE-SURGERY    COVID vaccine -no  Activity--Able to walk a flight of stairs without SOB and CP Anesthesia review:   Patient denies shortness of breath, fever, cough and chest pain at PAT appointment   All instructions explained to the patient, with a verbal understanding of the material. Patient agrees to go over the instructions while at home for a better understanding. Patient also instructed to self quarantine after being tested for COVID-19. The opportunity to ask questions was provided.

## 2022-01-22 ENCOUNTER — Encounter (HOSPITAL_COMMUNITY)
Admission: RE | Disposition: A | Discharge status: Home / Self Care | Payer: Self-pay | Source: Home / Self Care | Attending: Surgery

## 2022-01-22 ENCOUNTER — Ambulatory Visit (HOSPITAL_BASED_OUTPATIENT_CLINIC_OR_DEPARTMENT_OTHER): Payer: 59 | Admitting: Anesthesiology

## 2022-01-22 ENCOUNTER — Encounter (HOSPITAL_COMMUNITY): Payer: Self-pay | Admitting: Surgery

## 2022-01-22 ENCOUNTER — Ambulatory Visit (HOSPITAL_COMMUNITY): Payer: 59 | Admitting: Anesthesiology

## 2022-01-22 ENCOUNTER — Ambulatory Visit (HOSPITAL_COMMUNITY)
Admission: RE | Admit: 2022-01-22 | Discharge: 2022-01-22 | Disposition: A | Discharge status: Home / Self Care | Payer: 59 | Attending: Surgery | Admitting: Surgery

## 2022-01-22 DIAGNOSIS — F17210 Nicotine dependence, cigarettes, uncomplicated: Secondary | ICD-10-CM | POA: Diagnosis not present

## 2022-01-22 DIAGNOSIS — K4091 Unilateral inguinal hernia, without obstruction or gangrene, recurrent: Secondary | ICD-10-CM | POA: Insufficient documentation

## 2022-01-22 DIAGNOSIS — E669 Obesity, unspecified: Secondary | ICD-10-CM | POA: Insufficient documentation

## 2022-01-22 DIAGNOSIS — Z6830 Body mass index (BMI) 30.0-30.9, adult: Secondary | ICD-10-CM | POA: Insufficient documentation

## 2022-01-22 HISTORY — PX: INGUINAL HERNIA REPAIR: SHX194

## 2022-01-22 SURGERY — REPAIR, HERNIA, INGUINAL, LAPAROSCOPIC
Anesthesia: General | Laterality: Left

## 2022-01-22 MED ORDER — IBUPROFEN 800 MG PO TABS: 800.0000 mg | ORAL_TABLET | Freq: Three times a day (TID) | ORAL | 0 refills | Status: AC | PRN

## 2022-01-22 MED ORDER — ROCURONIUM BROMIDE 10 MG/ML (PF) SYRINGE
PREFILLED_SYRINGE | INTRAVENOUS | Status: DC | PRN
  Administered 2022-01-22: 10 mg via INTRAVENOUS
  Administered 2022-01-22 (×3): 20 mg via INTRAVENOUS
  Administered 2022-01-22: 50 mg via INTRAVENOUS

## 2022-01-22 MED ORDER — MIDAZOLAM HCL 5 MG/5ML IJ SOLN
INTRAMUSCULAR | Status: DC | PRN
  Administered 2022-01-22: 2 mg via INTRAVENOUS

## 2022-01-22 MED ORDER — SCOPOLAMINE 1 MG/3DAYS TD PT72: 1.0000 | MEDICATED_PATCH | TRANSDERMAL | Status: DC

## 2022-01-22 MED ORDER — BUPIVACAINE LIPOSOME 1.3 % IJ SUSP: 20.0000 mL | Freq: Once | INTRAMUSCULAR | Status: DC

## 2022-01-22 MED ORDER — OXYCODONE HCL 5 MG PO TABS
ORAL_TABLET | ORAL | Status: AC
  Filled 2022-01-22: qty 1

## 2022-01-22 MED ORDER — ORAL CARE MOUTH RINSE: 15.0000 mL | Freq: Once | OROMUCOSAL | Status: AC

## 2022-01-22 MED ORDER — MIDAZOLAM HCL 2 MG/2ML IJ SOLN
INTRAMUSCULAR | Status: AC
  Filled 2022-01-22: qty 2

## 2022-01-22 MED ORDER — ONDANSETRON HCL 4 MG/2ML IJ SOLN
INTRAMUSCULAR | Status: DC | PRN
  Administered 2022-01-22: 4 mg via INTRAVENOUS

## 2022-01-22 MED ORDER — ONDANSETRON HCL 4 MG/2ML IJ SOLN
4.0000 mg | Freq: Once | INTRAMUSCULAR | Status: DC | PRN
Start: 2022-01-22 — End: 2022-01-22

## 2022-01-22 MED ORDER — BUPIVACAINE-EPINEPHRINE 0.25% -1:200000 IJ SOLN
INTRAMUSCULAR | Status: DC | PRN
  Administered 2022-01-22: 15.5 mL

## 2022-01-22 MED ORDER — ACETAMINOPHEN 500 MG PO TABS: 1000.0000 mg | ORAL_TABLET | Freq: Once | ORAL | Status: AC

## 2022-01-22 MED ORDER — ONDANSETRON HCL 4 MG/2ML IJ SOLN
INTRAMUSCULAR | Status: AC
  Filled 2022-01-22: qty 2

## 2022-01-22 MED ORDER — BUPIVACAINE-EPINEPHRINE (PF) 0.25% -1:200000 IJ SOLN
INTRAMUSCULAR | Status: AC
  Filled 2022-01-22: qty 30

## 2022-01-22 MED ORDER — LACTATED RINGERS IV SOLN: INTRAVENOUS | Status: DC

## 2022-01-22 MED ORDER — FENTANYL CITRATE PF 50 MCG/ML IJ SOSY
PREFILLED_SYRINGE | INTRAMUSCULAR | Status: AC
  Filled 2022-01-22: qty 1

## 2022-01-22 MED ORDER — OXYCODONE HCL 5 MG PO TABS
5.0000 mg | ORAL_TABLET | Freq: Once | ORAL | Status: AC
  Administered 2022-01-22: 5 mg via ORAL

## 2022-01-22 MED ORDER — BUPIVACAINE LIPOSOME 1.3 % IJ SUSP
INTRAMUSCULAR | Status: DC | PRN
  Administered 2022-01-22: 15.5 mL

## 2022-01-22 MED ORDER — BUPIVACAINE LIPOSOME 1.3 % IJ SUSP
INTRAMUSCULAR | Status: AC
  Filled 2022-01-22: qty 20

## 2022-01-22 MED ORDER — CHLORHEXIDINE GLUCONATE CLOTH 2 % EX PADS: 6.0000 | MEDICATED_PAD | Freq: Once | CUTANEOUS | Status: DC

## 2022-01-22 MED ORDER — FENTANYL CITRATE (PF) 100 MCG/2ML IJ SOLN
INTRAMUSCULAR | Status: AC
  Filled 2022-01-22: qty 2

## 2022-01-22 MED ORDER — PHENYLEPHRINE 80 MCG/ML (10ML) SYRINGE FOR IV PUSH (FOR BLOOD PRESSURE SUPPORT)
PREFILLED_SYRINGE | INTRAVENOUS | Status: DC | PRN
  Administered 2022-01-22: 80 ug via INTRAVENOUS
  Administered 2022-01-22: 120 ug via INTRAVENOUS
  Administered 2022-01-22: 80 ug via INTRAVENOUS
  Administered 2022-01-22: 120 ug via INTRAVENOUS
  Administered 2022-01-22: 80 ug via INTRAVENOUS

## 2022-01-22 MED ORDER — CHLORHEXIDINE GLUCONATE 0.12 % MT SOLN
15.0000 mL | Freq: Once | OROMUCOSAL | Status: AC
  Administered 2022-01-22: 15 mL via OROMUCOSAL

## 2022-01-22 MED ORDER — SUGAMMADEX SODIUM 500 MG/5ML IV SOLN
INTRAVENOUS | Status: DC | PRN
  Administered 2022-01-22: 400 mg via INTRAVENOUS

## 2022-01-22 MED ORDER — DEXAMETHASONE SODIUM PHOSPHATE 10 MG/ML IJ SOLN
INTRAMUSCULAR | Status: DC | PRN
  Administered 2022-01-22: 10 mg via INTRAVENOUS

## 2022-01-22 MED ORDER — FENTANYL CITRATE PF 50 MCG/ML IJ SOSY
PREFILLED_SYRINGE | INTRAMUSCULAR | Status: AC
  Filled 2022-01-22: qty 2

## 2022-01-22 MED ORDER — ROCURONIUM BROMIDE 10 MG/ML (PF) SYRINGE
PREFILLED_SYRINGE | INTRAVENOUS | Status: AC
  Filled 2022-01-22: qty 10

## 2022-01-22 MED ORDER — LIDOCAINE HCL (CARDIAC) PF 100 MG/5ML IV SOSY
PREFILLED_SYRINGE | INTRAVENOUS | Status: DC | PRN
  Administered 2022-01-22: 100 mg via INTRATRACHEAL

## 2022-01-22 MED ORDER — DEXAMETHASONE SODIUM PHOSPHATE 10 MG/ML IJ SOLN
INTRAMUSCULAR | Status: AC
  Filled 2022-01-22: qty 1

## 2022-01-22 MED ORDER — SCOPOLAMINE 1 MG/3DAYS TD PT72
MEDICATED_PATCH | TRANSDERMAL | Status: AC
  Administered 2022-01-22: 1.5 mg via TRANSDERMAL
  Filled 2022-01-22: qty 1

## 2022-01-22 MED ORDER — FENTANYL CITRATE (PF) 100 MCG/2ML IJ SOLN
INTRAMUSCULAR | Status: DC | PRN
  Administered 2022-01-22: 100 ug via INTRAVENOUS

## 2022-01-22 MED ORDER — PROPOFOL 10 MG/ML IV BOLUS
INTRAVENOUS | Status: DC | PRN
  Administered 2022-01-22: 100 mg via INTRAVENOUS
  Administered 2022-01-22: 180 mg via INTRAVENOUS

## 2022-01-22 MED ORDER — 0.9 % SODIUM CHLORIDE (POUR BTL) OPTIME
TOPICAL | Status: DC | PRN
  Administered 2022-01-22: 1000 mL

## 2022-01-22 MED ORDER — EPHEDRINE SULFATE-NACL 50-0.9 MG/10ML-% IV SOSY
PREFILLED_SYRINGE | INTRAVENOUS | Status: DC | PRN
  Administered 2022-01-22 (×2): 5 mg via INTRAVENOUS
  Administered 2022-01-22: 10 mg via INTRAVENOUS

## 2022-01-22 MED ORDER — ACETAMINOPHEN 500 MG PO TABS
ORAL_TABLET | ORAL | Status: AC
  Administered 2022-01-22: 1000 mg via ORAL
  Filled 2022-01-22: qty 2

## 2022-01-22 MED ORDER — LIDOCAINE HCL (PF) 2 % IJ SOLN
INTRAMUSCULAR | Status: AC
  Filled 2022-01-22: qty 5

## 2022-01-22 MED ORDER — CEFAZOLIN SODIUM-DEXTROSE 2-4 GM/100ML-% IV SOLN
INTRAVENOUS | Status: AC
  Filled 2022-01-22: qty 100

## 2022-01-22 MED ORDER — FENTANYL CITRATE PF 50 MCG/ML IJ SOSY
25.0000 ug | PREFILLED_SYRINGE | INTRAMUSCULAR | Status: DC | PRN
  Administered 2022-01-22 (×3): 50 ug via INTRAVENOUS

## 2022-01-22 MED ORDER — CEFAZOLIN SODIUM-DEXTROSE 2-4 GM/100ML-% IV SOLN
2.0000 g | INTRAVENOUS | Status: AC
  Administered 2022-01-22: 2 g via INTRAVENOUS

## 2022-01-22 SURGICAL SUPPLY — 47 items
ADH SKN CLS APL DERMABOND .7 (GAUZE/BANDAGES/DRESSINGS) ×2
APL SKNCLS STERI-STRIP NONHPOA (GAUZE/BANDAGES/DRESSINGS) ×1
BAG COUNTER SPONGE SURGICOUNT (BAG) IMPLANT
BAG SPNG CNTER NS LX DISP (BAG)
BENZOIN TINCTURE PRP APPL 2/3 (GAUZE/BANDAGES/DRESSINGS) ×2 IMPLANT
BINDER ABDOMINAL 12 ML 46-62 (SOFTGOODS) ×1 IMPLANT
CABLE HIGH FREQUENCY MONO STRZ (ELECTRODE) ×2 IMPLANT
COVER SURGICAL LIGHT HANDLE (MISCELLANEOUS) ×2 IMPLANT
DERMABOND ADVANCED (GAUZE/BANDAGES/DRESSINGS) ×2
DERMABOND ADVANCED .7 DNX12 (GAUZE/BANDAGES/DRESSINGS) IMPLANT
DEVICE SECURE STRAP 25 ABSORB (INSTRUMENTS) IMPLANT
DISSECT BALLN SPACEMKR + OVL (BALLOONS) ×2
DISSECTOR BALLN SPACEMKR + OVL (BALLOONS) ×1 IMPLANT
DISSECTOR BLUNT TIP ENDO 5MM (MISCELLANEOUS) IMPLANT
DRAIN PENROSE 0.5X18 (DRAIN) ×1 IMPLANT
ELECT REM PT RETURN 15FT ADLT (MISCELLANEOUS) ×2 IMPLANT
GLOVE SURG LX 8.0 MICRO (GLOVE) ×1
GLOVE SURG LX STRL 8.0 MICRO (GLOVE) ×1 IMPLANT
GOWN STRL REUS W/ TWL XL LVL3 (GOWN DISPOSABLE) ×3 IMPLANT
GOWN STRL REUS W/TWL XL LVL3 (GOWN DISPOSABLE) ×6
IRRIG SUCT STRYKERFLOW 2 WTIP (MISCELLANEOUS)
IRRIGATION SUCT STRKRFLW 2 WTP (MISCELLANEOUS) IMPLANT
KIT BASIN OR (CUSTOM PROCEDURE TRAY) ×2 IMPLANT
KIT TURNOVER KIT A (KITS) IMPLANT
MARKER SKIN DUAL TIP RULER LAB (MISCELLANEOUS) ×2 IMPLANT
MESH HERNIA 6X6 BARD (Mesh General) IMPLANT
MESH HERNIA BARD 6X6 (Mesh General) ×1 IMPLANT
PAD POSITIONING PINK XL (MISCELLANEOUS) IMPLANT
SCISSORS LAP 5X35 DISP (ENDOMECHANICALS) IMPLANT
SET TUBE SMOKE EVAC HIGH FLOW (TUBING) ×2 IMPLANT
SLEEVE ADV FIXATION 5X100MM (TROCAR) ×2 IMPLANT
SPIKE FLUID TRANSFER (MISCELLANEOUS) ×2 IMPLANT
SUT MNCRL AB 4-0 PS2 18 (SUTURE) ×2 IMPLANT
SUT PROLENE 0 CT 2 (SUTURE) ×1 IMPLANT
SUT PROLENE 2 0 CT2 30 (SUTURE) ×2 IMPLANT
SUT VIC AB 2-0 SH 27 (SUTURE) ×2
SUT VIC AB 2-0 SH 27X BRD (SUTURE) IMPLANT
SUT VIC AB 3-0 SH 27 (SUTURE) ×2
SUT VIC AB 3-0 SH 27XBRD (SUTURE) IMPLANT
SUT VIC AB 4-0 SH 18 (SUTURE) ×2 IMPLANT
SYR 10ML ECCENTRIC (SYRINGE) ×2 IMPLANT
SYR 30ML LL (SYRINGE) ×2 IMPLANT
TACKER 5MM HERNIA 3.5CML NAB (ENDOMECHANICALS) IMPLANT
TRAY FOLEY MTR SLVR 16FR STAT (SET/KITS/TRAYS/PACK) IMPLANT
TRAY LAPAROSCOPIC (CUSTOM PROCEDURE TRAY) ×2 IMPLANT
TROCAR ADV FIXATION 5X100MM (TROCAR) ×2 IMPLANT
YANKAUER SUCT BULB TIP 10FT TU (MISCELLANEOUS) ×1 IMPLANT

## 2022-01-22 NOTE — Op Note (Signed)
Travis Wilkins  [birth year]   01/22/2022    PCP:  [facility] [place]   Surgeon: Wenda Low, MD, FACS  Asst:  Feliciana Rossetti, MD, FACS  Anes:  general  Preop Dx: Recurrent left inguinal hernia Postop Dx: same  Procedure: Laparoscopy with takedown of adhesions to colon, open left inguinal hernia with mesh Location Surgery: OR 1 at [facility] Complications:   None noted  EBL:   minimal cc  Drains: none  Description of Procedure:  The patient was taken to OR 1 .  After anesthesia was administered and the patient was prepped  with hibiclens and the a chloroprep solution  and a timeout was performed.  Access to the abdomen was achieved with a 5 mm through the left upper quadrant.  2 other ports were placed both 5 mm and the patient was placed in Trendelenburg and attention was directed to the left inguinal region.  Sigmoid colon was stuck up into this recurrent hernia sac which appeared to be a recurrent indirect and it seemed like it went lateral to the mesh.  The mesh had been closed and there was no exposed mesh to the abdominal contents.  After freeing this from the sac and then cut down on it from the outside and did a standard open repair.  I left the patient insufflated and so cut down into the external ring and mobilized the cord.  The cord structures were then dissected free from the sac structures and reduced into the abdomen.  The internal ring was carefully palpated and the ridge of mesh lay around it.  Medially I found this and was then uses to anchor the external mesh to the internal mesh.  Put a horizontal mattress suture of Prolene through this and did not tie it and held.  I cut a piece of mesh to fit and sutured along the inguinal ligament with a running 2-0 Prolene.  Medially sutured with 2-0 Prolene cutting the ends of brim around the cord structures.  These were then tucked behind the external oblique.  Medially the horizontal mattress suture was threaded  through the superior portion of the closure and it was tied down therefore anchoring the external mesh to the internal mesh.  The internal ring could now except the tip of my finger and seemed very small.  The cord structures were intact and were not violated.  Testicle was in the scrotum.  The external oblique was closed with running 2-0 Vicryl 4-0 Vicryl was used in the subcu and then subcuticularly wound was closed with running subcuticular 4-0 Monocryl.  Dermabond completed this closure  The patient tolerated the procedure well and was taken to the PACU in stable condition.     Matt B. Daphine Deutscher, MD, [facility], [place] [phone]

## 2022-01-22 NOTE — Anesthesia Procedure Notes (Signed)
Procedure Name: Intubation Date/Time: 01/22/2022 10:59 AM  Performed by: Donna Bernard, CRNAPre-anesthesia Checklist: Patient identified, Emergency Drugs available, Suction available, Patient being monitored and Timeout performed Patient Re-evaluated:Patient Re-evaluated prior to induction Oxygen Delivery Method: Circle system utilized Preoxygenation: Pre-oxygenation with 100% oxygen Induction Type: IV induction Ventilation: Mask ventilation without difficulty Laryngoscope Size: Miller and 2 Grade View: Grade II Tube type: Oral Tube size: 7.5 mm Number of attempts: 1 Airway Equipment and Method: Stylet Placement Confirmation: positive ETCO2, ETT inserted through vocal cords under direct vision, CO2 detector and breath sounds checked- equal and bilateral Secured at: 23 cm Tube secured with: Tape Dental Injury: Teeth and Oropharynx as per pre-operative assessment  Comments: Slightly anterior.  Beard. Bull neck.  Miller 3 blade recommended next intubation.

## 2022-01-22 NOTE — Anesthesia Postprocedure Evaluation (Signed)
Anesthesia Post Note  Patient: Travis Wilkins  Procedure(s) Performed: LAPAROSCOPIC  ASSISTED OPEN LEFT INGUINAL HERNIA REPAIR W/ MESH (Left)     Patient location during evaluation: PACU Anesthesia Type: General Level of consciousness: awake and alert Pain management: pain level controlled Vital Signs Assessment: post-procedure vital signs reviewed and stable Respiratory status: spontaneous breathing, nonlabored ventilation, respiratory function stable and patient connected to nasal cannula oxygen Cardiovascular status: blood pressure returned to baseline and stable Postop Assessment: no apparent nausea or vomiting Anesthetic complications: no   No notable events documented.  Last Vitals:  Vitals:   01/22/22 1415 01/22/22 1449  BP: 120/76 112/75  Pulse: 85 90  Resp: 12 18  Temp: 36.4 C 36.4 C  SpO2: 96% 96%    Last Pain:  Vitals:   01/22/22 1449  TempSrc:   PainSc: 5                  Collene Schlichter

## 2022-01-22 NOTE — Interval H&P Note (Signed)
History and Physical Interval Note:  01/22/2022 9:52 AM  Travis Wilkins  has presented today for surgery, with the diagnosis of RECURRENT LEFT INGUINAL HERNIA.  The various methods of treatment have been discussed with the patient and family. After consideration of risks, benefits and other options for treatment, the patient has consented to  Procedure(s): LAPAROSCOPIC, POSSIBLE OPEN LEFT INGUINAL HERNIA REPAIR (Left) as a surgical intervention.  The patient's history has been reviewed, patient examined, no change in status, stable for surgery.  I have reviewed the patient's chart and labs.  Questions were answered to the patient's satisfaction.     [name]

## 2022-01-22 NOTE — Interval H&P Note (Signed)
History and Physical Interval Note:  01/22/2022 9:58 AM  Travis Wilkins  has presented today for surgery, with the diagnosis of RECURRENT LEFT INGUINAL HERNIA.  The various methods of treatment have been discussed with the patient and family. After consideration of risks, benefits and other options for treatment, the patient has consented to  Procedure(s): LAPAROSCOPIC, POSSIBLE OPEN LEFT INGUINAL HERNIA REPAIR (Left) as a surgical intervention.  The patient's history has been reviewed, patient examined, no change in status, stable for surgery.  I have reviewed the patient's chart and labs.  Questions were answered to the patient's satisfaction.     [name]

## 2022-01-22 NOTE — Transfer of Care (Signed)
Immediate Anesthesia Transfer of Care Note  Patient: Travis Wilkins  Procedure(s) Performed: LAPAROSCOPIC  ASSISTED OPEN LEFT INGUINAL HERNIA REPAIR W/ MESH (Left)  Patient Location: PACU  Anesthesia Type:General  Level of Consciousness: awake, drowsy and patient cooperative  Airway & Oxygen Therapy: Patient Spontanous Breathing and Patient connected to face mask oxygen  Post-op Assessment: Report given to RN and Post -op Vital signs reviewed and stable  Post vital signs: stable  Last Vitals:  Vitals Value Taken Time  BP 113/70 01/22/22 1320  Temp    Pulse 99 01/22/22 1323  Resp 18 01/22/22 1323  SpO2 97 % 01/22/22 1323  Vitals shown include unvalidated device data.  Last Pain:  Vitals:   01/22/22 0928  TempSrc:   PainSc: 0-No pain         Complications: No notable events documented.

## 2022-01-22 NOTE — Anesthesia Preprocedure Evaluation (Addendum)
Anesthesia Evaluation  Patient identified by MRN, date of birth, ID band Patient awake    Reviewed: Allergy & Precautions, NPO status , Patient's Chart, lab work & pertinent test results  Airway Mallampati: III  TM Distance: <3 FB Neck ROM: Full    Dental  (+) Teeth Intact, Dental Advisory Given   Pulmonary Current Smoker and Patient abstained from smoking.,    Pulmonary exam normal breath sounds clear to auscultation       Cardiovascular negative cardio ROS Normal cardiovascular exam Rhythm:Regular Rate:Normal     Neuro/Psych negative neurological ROS  negative psych ROS   GI/Hepatic Neg liver ROS, GERD  Medicated,RECURRENT LEFT INGUINAL HERNIA   Endo/Other  negative endocrine ROSObesity   Renal/GU negative Renal ROS     Musculoskeletal negative musculoskeletal ROS (+)   Abdominal   Peds  Hematology negative hematology ROS (+)   Anesthesia Other Findings Day of surgery medications reviewed with the patient.  Reproductive/Obstetrics                            Anesthesia Physical Anesthesia Plan  ASA: 2  Anesthesia Plan: General   Post-op Pain Management: Tylenol PO (pre-op)*   Induction: Intravenous  PONV Risk Score and Plan: 2 and Midazolam, Dexamethasone and Ondansetron  Airway Management Planned: Oral ETT  Additional Equipment:   Intra-op Plan:   Post-operative Plan: Extubation in OR  Informed Consent: I have reviewed the patients History and Physical, chart, labs and discussed the procedure including the risks, benefits and alternatives for the proposed anesthesia with the patient or authorized representative who has indicated his/her understanding and acceptance.     Dental advisory given  Plan Discussed with: CRNA  Anesthesia Plan Comments:         Anesthesia Quick Evaluation

## 2022-01-23 ENCOUNTER — Encounter (HOSPITAL_COMMUNITY): Payer: Self-pay | Admitting: Surgery

## 2022-02-12 ENCOUNTER — Other Ambulatory Visit (HOSPITAL_COMMUNITY): Payer: 59

## 2022-06-25 ENCOUNTER — Other Ambulatory Visit: Payer: Self-pay | Admitting: Surgery

## 2022-06-25 DIAGNOSIS — N5089 Other specified disorders of the male genital organs: Secondary | ICD-10-CM

## 2022-06-25 DIAGNOSIS — K4091 Unilateral inguinal hernia, without obstruction or gangrene, recurrent: Secondary | ICD-10-CM

## 2022-07-04 ENCOUNTER — Ambulatory Visit
Admission: RE | Admit: 2022-07-04 | Discharge: 2022-07-04 | Disposition: A | Discharge status: Home / Self Care | Payer: 59 | Source: Ambulatory Visit | Attending: Surgery | Admitting: Surgery

## 2022-07-04 DIAGNOSIS — K4091 Unilateral inguinal hernia, without obstruction or gangrene, recurrent: Secondary | ICD-10-CM

## 2022-07-04 MED ORDER — IOPAMIDOL (ISOVUE-300) INJECTION 61%
100.0000 mL | Freq: Once | INTRAVENOUS | Status: AC | PRN
  Administered 2022-07-04: 100 mL via INTRAVENOUS

## 2022-07-08 ENCOUNTER — Other Ambulatory Visit: Payer: 59

## 2022-07-08 ENCOUNTER — Ambulatory Visit
Admission: RE | Admit: 2022-07-08 | Discharge: 2022-07-08 | Disposition: A | Discharge status: Home / Self Care | Payer: 59 | Source: Ambulatory Visit | Attending: Surgery | Admitting: Surgery

## 2022-07-08 DIAGNOSIS — N5089 Other specified disorders of the male genital organs: Secondary | ICD-10-CM

## 2022-09-20 ENCOUNTER — Ambulatory Visit: Payer: Self-pay | Admitting: Surgery

## 2022-09-20 NOTE — Progress Notes (Signed)
Sent message, via epic in basket, requesting orders in epic from surgeon.  

## 2022-09-23 NOTE — Patient Instructions (Signed)
SURGICAL WAITING ROOM VISITATION  Patients having surgery or a procedure may have no more than 2 support people in the waiting area - these visitors may rotate.    Children under the age of 34 must have an adult with them who is not the patient.  Due to an increase in RSV and influenza rates and associated hospitalizations, children ages 86 and under may not visit patients in [place].  If the patient needs to stay at the hospital during part of their recovery, the visitor guidelines for inpatient rooms apply. Pre-op nurse will coordinate an appropriate time for 1 support person to accompany patient in pre-op.  This support person may not rotate.    Please refer to the [facility] website for the visitor guidelines for Inpatients (after your surgery is over and you are in a regular room).     Your procedure is scheduled on: 10/07/22   Report to [facility] Main Entrance    Report to admitting at 8:45 AM   Call this number if you have problems the morning of surgery [phone]   Do not eat food :After Midnight.   After Midnight you may have the following liquids until 8:00 AM DAY OF SURGERY  Water Non-Citrus Juices (without pulp, NO RED-Apple, White grape, White cranberry) Black Coffee (NO MILK/CREAM OR CREAMERS, sugar ok)  Clear Tea (NO MILK/CREAM OR CREAMERS, sugar ok) regular and decaf                             Plain Jell-O (NO RED)                                           Fruit ices (not with fruit pulp, NO RED)                                     Popsicles (NO RED)                                                               Sports drinks like Gatorade (NO RED)          If you have questions, please contact your surgeon's office.   FOLLOW BOWEL PREP AND ANY ADDITIONAL PRE OP INSTRUCTIONS YOU RECEIVED FROM YOUR SURGEON'S OFFICE!!!     Oral Hygiene is also important to reduce your risk of infection.                                    Remember -  BRUSH YOUR TEETH THE MORNING OF SURGERY WITH YOUR REGULAR TOOTHPASTE  DENTURES WILL BE REMOVED PRIOR TO SURGERY PLEASE DO NOT APPLY "Poly grip" OR ADHESIVES!!!   Do NOT smoke after Midnight   Take these medicines the morning of surgery with A SIP OF WATER: Methadone (please contact your prescriber and confirm)  You may not have any metal on your body including jewelry, and body piercing             Do not wear lotions, powders, cologne, or deodorant  Do not shave  48 hours prior to surgery.               Men may shave face and neck.   Do not bring valuables to the hospital. [place].   Contacts, glasses, dentures or bridgework may not be worn into surgery.  DO NOT [place]. PHARMACY WILL DISPENSE MEDICATIONS LISTED ON YOUR MEDICATION LIST TO YOU DURING YOUR ADMISSION [place]!    Patients discharged on the day of surgery will not be allowed to drive home.  Someone NEEDS to stay with you for the first 24 hours after anesthesia.   Special Instructions: Bring a copy of your healthcare power of attorney and living will documents the day of surgery if you haven't scanned them before.              Please read over the following fact sheets you were given: IF [place] [phone] Schneiders    If you received a COVID test during your pre-op visit  it is requested that you wear a mask when out in public, stay away from anyone that may not be feeling well and notify your surgeon if you develop symptoms. If you test positive for Covid or have been in contact with anyone that has tested positive in the last 10 days please notify you surgeon.    [place] - Preparing for Surgery Before surgery, you can play an important role.  Because skin is not sterile, your skin needs to be as free of germs as possible.  You can reduce  the number of germs on your skin by washing with CHG (chlorahexidine gluconate) soap before surgery.  CHG is an antiseptic cleaner which kills germs and bonds with the skin to continue killing germs even after washing. Please DO NOT use if you have an allergy to CHG or antibacterial soaps.  If your skin becomes reddened/irritated stop using the CHG and inform your nurse when you arrive at Short Stay. Do not shave (including legs and underarms) for at least 48 hours prior to the first CHG shower.  You may shave your face/neck.  Please follow these instructions carefully:  1.  Shower with CHG Soap the night before surgery and the  morning of surgery.  2.  If you choose to wash your hair, wash your hair first as usual with your normal  shampoo.  3.  After you shampoo, rinse your hair and body thoroughly to remove the shampoo.                             4.  Use CHG as you would any other liquid soap.  You can apply chg directly to the skin and wash.  Gently with a scrungie or clean washcloth.  5.  Apply the CHG Soap to your body ONLY FROM THE NECK DOWN.   Do   not use on face/ open  Wound or open sores. Avoid contact with eyes, ears mouth and   genitals (private parts).                       Wash face,  Genitals (private parts) with your normal soap.             6.  Wash thoroughly, paying special attention to the area where your    surgery  will be performed.  7.  Thoroughly rinse your body with warm water from the neck down.  8.  DO NOT shower/wash with your normal soap after using and rinsing off the CHG Soap.                9.  Pat yourself dry with a clean towel.            10.  Wear clean pajamas.            11.  Place clean sheets on your bed the night of your first shower and do not  sleep with pets. Day of Surgery : Do not apply any lotions/deodorants the morning of surgery.  Please wear clean clothes to the hospital/surgery center.  FAILURE TO FOLLOW THESE  INSTRUCTIONS MAY RESULT IN THE CANCELLATION OF YOUR SURGERY  PATIENT SIGNATURE_________________________________  NURSE SIGNATURE__________________________________  ________________________________________________________________________

## 2022-09-23 NOTE — Progress Notes (Signed)
Patient is on Methadone. Instructed to contact prescriber and ask if he needs any special instructions for upcoming surgery. Patient verbalized understanding and states he has an upcoming appointment with them next week.  COVID Vaccine Completed: yes  Date of COVID positive in last 90 days: no  PCP - [facility]  Cardiologist - n/a  Chest x-ray - n/a EKG - n/a Stress Test - n/a ECHO - n/a Cardiac Cath - n/a Pacemaker/ICD device last checked: n/a Spinal Cord Stimulator: n/a  Bowel Prep - no  Sleep Study - n/a CPAP -   Fasting Blood Sugar - n/a Checks Blood Sugar _____ times a day  Last dose of GLP1 agonist-  N/A GLP1 instructions:  N/A   Last dose of SGLT-2 inhibitors-  N/A SGLT-2 instructions: N/A   Blood Thinner Instructions: n/a Aspirin Instructions: Last Dose:  Activity level: Can go up a flight of stairs and perform activities of daily living without stopping and without symptoms of chest pain or shortness of breath.   Anesthesia review:   Patient denies shortness of breath, fever, cough and chest pain at PAT appointment  Patient verbalized understanding of instructions that were given to them at the PAT appointment. Patient was also instructed that they will need to review over the PAT instructions again at home before surgery.

## 2022-09-24 ENCOUNTER — Encounter (HOSPITAL_COMMUNITY): Payer: Self-pay

## 2022-09-24 ENCOUNTER — Encounter (HOSPITAL_COMMUNITY)
Admission: RE | Admit: 2022-09-24 | Discharge: 2022-09-24 | Disposition: A | Discharge status: Home / Self Care | Payer: 59 | Source: Ambulatory Visit | Attending: Surgery

## 2022-09-24 VITALS — BP 139/81 | HR 80 | Temp 98.4°F | Resp 16 | Ht 70.0 in | Wt 206.0 lb

## 2022-09-24 DIAGNOSIS — Z01818 Encounter for other preprocedural examination: Secondary | ICD-10-CM

## 2022-09-24 HISTORY — DX: Headache, unspecified: R51.9

## 2022-09-24 LAB — CBC
HCT: 42.6 % (ref 39.0–52.0)
Hemoglobin: 14.2 g/dL (ref 13.0–17.0)
MCH: 29.6 pg (ref 26.0–34.0)
MCHC: 33.3 g/dL (ref 30.0–36.0)
MCV: 88.8 fL (ref 80.0–100.0)
Platelets: 225 10*3/uL (ref 150–400)
RBC: 4.8 MIL/uL (ref 4.22–5.81)
RDW: 13.2 % (ref 11.5–15.5)
WBC: 5.6 10*3/uL (ref 4.0–10.5)
nRBC: 0 % (ref 0.0–0.2)

## 2022-09-24 LAB — BASIC METABOLIC PANEL
Anion gap: 7 (ref 5–15)
BUN: 15 mg/dL (ref 6–20)
CO2: 24 mmol/L (ref 22–32)
Calcium: 8.8 mg/dL — ABNORMAL LOW (ref 8.9–10.3)
Chloride: 105 mmol/L (ref 98–111)
Creatinine, Ser: 0.7 mg/dL (ref 0.61–1.24)
GFR, Estimated: >60 mL/min (ref >60)
Glucose, Bld: 114 mg/dL — ABNORMAL HIGH (ref 70–99)
Potassium: 3.9 mmol/L (ref 3.5–5.1)
Sodium: 136 mmol/L (ref 135–145)

## 2022-10-07 ENCOUNTER — Encounter (HOSPITAL_COMMUNITY): Payer: Self-pay | Admitting: Surgery

## 2022-10-07 ENCOUNTER — Other Ambulatory Visit: Payer: Self-pay

## 2022-10-07 ENCOUNTER — Ambulatory Visit (HOSPITAL_COMMUNITY): Payer: 59 | Admitting: Anesthesiology

## 2022-10-07 ENCOUNTER — Encounter (HOSPITAL_COMMUNITY)
Admission: RE | Disposition: A | Discharge status: Home / Self Care | Payer: Self-pay | Source: Ambulatory Visit | Attending: Surgery

## 2022-10-07 ENCOUNTER — Ambulatory Visit (HOSPITAL_COMMUNITY)
Admission: RE | Admit: 2022-10-07 | Discharge: 2022-10-07 | Disposition: A | Discharge status: Home / Self Care | Payer: 59 | Source: Ambulatory Visit | Attending: Surgery | Admitting: Surgery

## 2022-10-07 ENCOUNTER — Ambulatory Visit (HOSPITAL_BASED_OUTPATIENT_CLINIC_OR_DEPARTMENT_OTHER): Payer: 59 | Admitting: Anesthesiology

## 2022-10-07 DIAGNOSIS — J449 Chronic obstructive pulmonary disease, unspecified: Secondary | ICD-10-CM

## 2022-10-07 DIAGNOSIS — K4091 Unilateral inguinal hernia, without obstruction or gangrene, recurrent: Secondary | ICD-10-CM | POA: Insufficient documentation

## 2022-10-07 DIAGNOSIS — K219 Gastro-esophageal reflux disease without esophagitis: Secondary | ICD-10-CM | POA: Insufficient documentation

## 2022-10-07 DIAGNOSIS — K4001 Bilateral inguinal hernia, with obstruction, without gangrene, recurrent: Secondary | ICD-10-CM | POA: Diagnosis not present

## 2022-10-07 DIAGNOSIS — K429 Umbilical hernia without obstruction or gangrene: Secondary | ICD-10-CM | POA: Diagnosis not present

## 2022-10-07 DIAGNOSIS — F17210 Nicotine dependence, cigarettes, uncomplicated: Secondary | ICD-10-CM | POA: Insufficient documentation

## 2022-10-07 HISTORY — PX: XI ROBOTIC ASSISTED INGUINAL HERNIA REPAIR WITH MESH: SHX6706

## 2022-10-07 HISTORY — PX: UMBILICAL HERNIA REPAIR: SHX196

## 2022-10-07 SURGERY — REPAIR, HERNIA, INGUINAL, ROBOT-ASSISTED, LAPAROSCOPIC, USING MESH
Anesthesia: General | Site: Abdomen

## 2022-10-07 MED ORDER — ROCURONIUM BROMIDE 10 MG/ML (PF) SYRINGE
PREFILLED_SYRINGE | INTRAVENOUS | Status: DC | PRN
  Administered 2022-10-07: 40 mg via INTRAVENOUS
  Administered 2022-10-07 (×2): 10 mg via INTRAVENOUS
  Administered 2022-10-07: 60 mg via INTRAVENOUS

## 2022-10-07 MED ORDER — HEPARIN SODIUM (PORCINE) 5000 UNIT/ML IJ SOLN
5000.0000 [IU] | Freq: Once | INTRAMUSCULAR | Status: AC
  Administered 2022-10-07: 5000 [IU] via SUBCUTANEOUS
  Filled 2022-10-07: qty 1

## 2022-10-07 MED ORDER — MEPERIDINE HCL 50 MG/ML IJ SOLN: 6.2500 mg | INTRAMUSCULAR | Status: DC | PRN

## 2022-10-07 MED ORDER — PHENYLEPHRINE HCL (PRESSORS) 10 MG/ML IV SOLN
INTRAVENOUS | Status: AC
  Filled 2022-10-07: qty 1

## 2022-10-07 MED ORDER — DEXMEDETOMIDINE HCL IN NACL 80 MCG/20ML IV SOLN
INTRAVENOUS | Status: DC | PRN
  Administered 2022-10-07: 4 ug via BUCCAL
  Administered 2022-10-07 (×2): 8 ug via BUCCAL

## 2022-10-07 MED ORDER — FENTANYL CITRATE (PF) 250 MCG/5ML IJ SOLN
INTRAMUSCULAR | Status: DC | PRN
  Administered 2022-10-07: 100 ug via INTRAVENOUS
  Administered 2022-10-07 (×2): 50 ug via INTRAVENOUS

## 2022-10-07 MED ORDER — MIDAZOLAM HCL 2 MG/2ML IJ SOLN: 0.5000 mg | Freq: Once | INTRAMUSCULAR | Status: DC | PRN

## 2022-10-07 MED ORDER — HYDROMORPHONE HCL 1 MG/ML IJ SOLN
INTRAMUSCULAR | Status: AC
  Filled 2022-10-07: qty 1

## 2022-10-07 MED ORDER — BUPIVACAINE LIPOSOME 1.3 % IJ SUSP
INTRAMUSCULAR | Status: DC | PRN
  Administered 2022-10-07: 20 mL

## 2022-10-07 MED ORDER — GABAPENTIN 300 MG PO CAPS
300.0000 mg | ORAL_CAPSULE | ORAL | Status: AC
  Administered 2022-10-07: 300 mg via ORAL
  Filled 2022-10-07: qty 1

## 2022-10-07 MED ORDER — LACTATED RINGERS IV SOLN: INTRAVENOUS | Status: DC | PRN

## 2022-10-07 MED ORDER — 0.9 % SODIUM CHLORIDE (POUR BTL) OPTIME
TOPICAL | Status: DC | PRN
  Administered 2022-10-07: 1000 mL

## 2022-10-07 MED ORDER — PROMETHAZINE HCL 25 MG/ML IJ SOLN: 6.2500 mg | INTRAMUSCULAR | Status: DC | PRN

## 2022-10-07 MED ORDER — HYDROMORPHONE HCL 1 MG/ML IJ SOLN
0.2500 mg | INTRAMUSCULAR | Status: DC | PRN
  Administered 2022-10-07 (×4): 0.5 mg via INTRAVENOUS

## 2022-10-07 MED ORDER — PROPOFOL 10 MG/ML IV BOLUS
INTRAVENOUS | Status: AC
  Filled 2022-10-07: qty 20

## 2022-10-07 MED ORDER — ORAL CARE MOUTH RINSE: 15.0000 mL | Freq: Once | OROMUCOSAL | Status: AC

## 2022-10-07 MED ORDER — BUPIVACAINE LIPOSOME 1.3 % IJ SUSP
INTRAMUSCULAR | Status: AC
  Filled 2022-10-07: qty 20

## 2022-10-07 MED ORDER — CHLORHEXIDINE GLUCONATE CLOTH 2 % EX PADS: 6.0000 | MEDICATED_PAD | Freq: Once | CUTANEOUS | Status: DC

## 2022-10-07 MED ORDER — ONDANSETRON HCL 4 MG/2ML IJ SOLN
INTRAMUSCULAR | Status: AC
  Filled 2022-10-07: qty 2

## 2022-10-07 MED ORDER — LIDOCAINE 2% (20 MG/ML) 5 ML SYRINGE
INTRAMUSCULAR | Status: DC | PRN
  Administered 2022-10-07: 20 mg via INTRAVENOUS
  Administered 2022-10-07: 1.5 mg/kg/h via INTRAVENOUS

## 2022-10-07 MED ORDER — KETAMINE HCL 50 MG/5ML IJ SOSY
PREFILLED_SYRINGE | INTRAMUSCULAR | Status: AC
  Filled 2022-10-07: qty 5

## 2022-10-07 MED ORDER — OXYCODONE HCL 5 MG PO TABS
5.0000 mg | ORAL_TABLET | Freq: Once | ORAL | Status: AC | PRN
  Administered 2022-10-07: 5 mg via ORAL

## 2022-10-07 MED ORDER — CHLORHEXIDINE GLUCONATE 0.12 % MT SOLN
15.0000 mL | Freq: Once | OROMUCOSAL | Status: AC
  Administered 2022-10-07: 15 mL via OROMUCOSAL

## 2022-10-07 MED ORDER — EPHEDRINE SULFATE-NACL 50-0.9 MG/10ML-% IV SOSY
PREFILLED_SYRINGE | INTRAVENOUS | Status: DC | PRN
  Administered 2022-10-07 (×2): 5 mg via INTRAVENOUS

## 2022-10-07 MED ORDER — CEFAZOLIN SODIUM-DEXTROSE 2-4 GM/100ML-% IV SOLN
INTRAVENOUS | Status: AC
  Filled 2022-10-07: qty 100

## 2022-10-07 MED ORDER — DEXMEDETOMIDINE HCL IN NACL 80 MCG/20ML IV SOLN
INTRAVENOUS | Status: AC
  Filled 2022-10-07: qty 20

## 2022-10-07 MED ORDER — FENTANYL CITRATE (PF) 250 MCG/5ML IJ SOLN
INTRAMUSCULAR | Status: AC
  Filled 2022-10-07: qty 5

## 2022-10-07 MED ORDER — BUPIVACAINE LIPOSOME 1.3 % IJ SUSP: 20.0000 mL | Freq: Once | INTRAMUSCULAR | Status: DC

## 2022-10-07 MED ORDER — OXYCODONE HCL 5 MG PO TABS
ORAL_TABLET | ORAL | Status: AC
  Filled 2022-10-07: qty 1

## 2022-10-07 MED ORDER — OXYCODONE HCL 5 MG/5ML PO SOLN: 5.0000 mg | Freq: Once | ORAL | Status: AC | PRN

## 2022-10-07 MED ORDER — ROCURONIUM BROMIDE 10 MG/ML (PF) SYRINGE
PREFILLED_SYRINGE | INTRAVENOUS | Status: AC
  Filled 2022-10-07: qty 10

## 2022-10-07 MED ORDER — ONDANSETRON HCL 4 MG/2ML IJ SOLN
INTRAMUSCULAR | Status: DC | PRN
  Administered 2022-10-07: 4 mg via INTRAVENOUS

## 2022-10-07 MED ORDER — DEXAMETHASONE SODIUM PHOSPHATE 10 MG/ML IJ SOLN
INTRAMUSCULAR | Status: DC | PRN
  Administered 2022-10-07: 10 mg via INTRAVENOUS

## 2022-10-07 MED ORDER — MIDAZOLAM HCL 2 MG/2ML IJ SOLN
INTRAMUSCULAR | Status: DC | PRN
  Administered 2022-10-07: 2 mg via INTRAVENOUS

## 2022-10-07 MED ORDER — LACTATED RINGERS IV SOLN: INTRAVENOUS | Status: DC

## 2022-10-07 MED ORDER — LIDOCAINE HCL (PF) 2 % IJ SOLN
INTRAMUSCULAR | Status: AC
  Filled 2022-10-07: qty 5

## 2022-10-07 MED ORDER — DEXAMETHASONE SODIUM PHOSPHATE 10 MG/ML IJ SOLN
INTRAMUSCULAR | Status: AC
  Filled 2022-10-07: qty 1

## 2022-10-07 MED ORDER — LIDOCAINE HCL (PF) 2 % IJ SOLN
INTRAMUSCULAR | Status: AC
  Filled 2022-10-07: qty 20

## 2022-10-07 MED ORDER — PROPOFOL 10 MG/ML IV BOLUS
INTRAVENOUS | Status: DC | PRN
  Administered 2022-10-07: 150 mg via INTRAVENOUS

## 2022-10-07 MED ORDER — EPHEDRINE 5 MG/ML INJ
INTRAVENOUS | Status: AC
  Filled 2022-10-07: qty 5

## 2022-10-07 MED ORDER — CEFAZOLIN SODIUM-DEXTROSE 2-4 GM/100ML-% IV SOLN
2.0000 g | INTRAVENOUS | Status: AC
  Administered 2022-10-07: 2 g via INTRAVENOUS
  Filled 2022-10-07: qty 100

## 2022-10-07 MED ORDER — BUPIVACAINE-EPINEPHRINE 0.25% -1:200000 IJ SOLN
INTRAMUSCULAR | Status: DC | PRN
  Administered 2022-10-07: 30 mL

## 2022-10-07 MED ORDER — KETAMINE HCL 10 MG/ML IJ SOLN
INTRAMUSCULAR | Status: DC | PRN
  Administered 2022-10-07: 10 mg via INTRAVENOUS
  Administered 2022-10-07: 30 mg via INTRAVENOUS
  Administered 2022-10-07: 10 mg via INTRAVENOUS

## 2022-10-07 MED ORDER — MIDAZOLAM HCL 2 MG/2ML IJ SOLN
INTRAMUSCULAR | Status: AC
  Filled 2022-10-07: qty 2

## 2022-10-07 MED ORDER — KETOROLAC TROMETHAMINE 15 MG/ML IJ SOLN
15.0000 mg | INTRAMUSCULAR | Status: AC
  Administered 2022-10-07: 15 mg via INTRAVENOUS
  Filled 2022-10-07: qty 1

## 2022-10-07 MED ORDER — SUGAMMADEX SODIUM 200 MG/2ML IV SOLN
INTRAVENOUS | Status: DC | PRN
  Administered 2022-10-07: 200 mg via INTRAVENOUS

## 2022-10-07 MED ORDER — BUPIVACAINE-EPINEPHRINE (PF) 0.25% -1:200000 IJ SOLN
INTRAMUSCULAR | Status: AC
  Filled 2022-10-07: qty 30

## 2022-10-07 MED ORDER — ACETAMINOPHEN 500 MG PO TABS
1000.0000 mg | ORAL_TABLET | ORAL | Status: AC
  Administered 2022-10-07: 1000 mg via ORAL
  Filled 2022-10-07: qty 2

## 2022-10-07 SURGICAL SUPPLY — 53 items
ADH SKN CLS APL DERMABOND .7 (GAUZE/BANDAGES/DRESSINGS) ×2
BALL CTTN LRG ABS STRL LF (GAUZE/BANDAGES/DRESSINGS)
COTTONBALL LRG STERILE PKG (GAUZE/BANDAGES/DRESSINGS) ×2 IMPLANT
COVER SURGICAL LIGHT HANDLE (MISCELLANEOUS) ×2 IMPLANT
COVER TIP SHEARS 8 DVNC (MISCELLANEOUS) IMPLANT
COVER TIP SHEARS 8MM DA VINCI (MISCELLANEOUS) ×2
DERMABOND ADVANCED .7 DNX12 (GAUZE/BANDAGES/DRESSINGS) ×2 IMPLANT
DRAPE ARM DVNC X/XI (DISPOSABLE) IMPLANT
DRAPE COLUMN DVNC XI (DISPOSABLE) IMPLANT
DRAPE DA VINCI XI ARM (DISPOSABLE) ×8
DRAPE DA VINCI XI COLUMN (DISPOSABLE) ×2
DRAPE LAPAROTOMY T 98X78 PEDS (DRAPES) ×2 IMPLANT
DRSG TEGADERM 4X4.75 (GAUZE/BANDAGES/DRESSINGS) ×2 IMPLANT
ELECT PENCIL ROCKER SW 15FT (MISCELLANEOUS) IMPLANT
ELECT REM PT RETURN 15FT ADLT (MISCELLANEOUS) ×2 IMPLANT
GAUZE 4X4 16PLY ~~LOC~~+RFID DBL (SPONGE) IMPLANT
GLOVE BIOGEL PI IND STRL 8 (GLOVE) ×2 IMPLANT
GOWN STRL REUS W/ TWL XL LVL3 (GOWN DISPOSABLE) ×2 IMPLANT
GOWN STRL REUS W/TWL XL LVL3 (GOWN DISPOSABLE) ×2
KIT BASIN OR (CUSTOM PROCEDURE TRAY) ×2 IMPLANT
KIT TURNOVER KIT A (KITS) IMPLANT
L-HOOK LAP DISP 36CM (ELECTROSURGICAL) ×2
LHOOK LAP DISP 36CM (ELECTROSURGICAL) IMPLANT
MARKER SKIN DUAL TIP RULER LAB (MISCELLANEOUS) IMPLANT
MESH 3DMAX 5X7 LT XLRG (Mesh General) IMPLANT
MESH 3DMAX 5X7 RT XLRG (Mesh General) IMPLANT
NDL HYPO 22X1.5 SAFETY MO (MISCELLANEOUS) ×2 IMPLANT
NDL SPNL 22GX3.5 QUINCKE BK (NEEDLE) IMPLANT
NEEDLE HYPO 22X1.5 SAFETY MO (MISCELLANEOUS) IMPLANT
NEEDLE SAFETY HYPO 22GAX1.5 (MISCELLANEOUS)
NEEDLE SPNL 22GX3.5 QUINCKE BK (NEEDLE) ×2 IMPLANT
NS IRRIG 1000ML POUR BTL (IV SOLUTION) ×2 IMPLANT
OBTURATOR OPTICAL STANDARD 8MM (TROCAR) ×2
OBTURATOR OPTICAL STND 8 DVNC (TROCAR) ×2
OBTURATOR OPTICALSTD 8 DVNC (TROCAR) IMPLANT
PACK CARDIOVASCULAR III (CUSTOM PROCEDURE TRAY) IMPLANT
PACK GENERAL/GYN (CUSTOM PROCEDURE TRAY) ×2 IMPLANT
SEAL CANN UNIV 5-8 DVNC XI (MISCELLANEOUS) IMPLANT
SEAL XI 5MM-8MM UNIVERSAL (MISCELLANEOUS) ×6
SET TUBE SMOKE EVAC HIGH FLOW (TUBING) IMPLANT
SPIKE FLUID TRANSFER (MISCELLANEOUS) ×2 IMPLANT
SUT ETHIBOND 0 36 GRN (SUTURE) IMPLANT
SUT MNCRL AB 4-0 PS2 18 (SUTURE) ×2 IMPLANT
SUT NOVA NAB GS-21 0 18 T12 DT (SUTURE) IMPLANT
SUT PDS AB 2-0 CT2 27 (SUTURE) IMPLANT
SUT VIC AB 2-0 SH 27 (SUTURE) ×6
SUT VIC AB 2-0 SH 27X BRD (SUTURE) IMPLANT
SUT VIC AB 3-0 SH 8-18 (SUTURE) ×2 IMPLANT
SUT VLOC 180 2-0 6IN GS21 (SUTURE) IMPLANT
SUT VLOC 180 2-0 9IN GS21 (SUTURE) IMPLANT
SYR CONTROL 10ML LL (SYRINGE) ×2 IMPLANT
TOWEL OR 17X26 10 PK STRL BLUE (TOWEL DISPOSABLE) ×2 IMPLANT
TRAY FOLEY MTR SLVR 16FR STAT (SET/KITS/TRAYS/PACK) IMPLANT

## 2022-10-07 NOTE — H&P (Signed)
Admitting Physician: Nickola Major Fusako Tanabe  Service: General Surgery  CC: Hernias  Subjective   HPI: Travis Wilkins is an 57 y.o. male who is here for hernia repair  Past Medical History:  Diagnosis Date   GERD (gastroesophageal reflux disease)    Headache    History of kidney stones    Horseshoe kidney     Past Surgical History:  Procedure Laterality Date   COLONOSCOPY     HERNIA REPAIR Left 09/21/2021   INGUINAL HERNIA REPAIR Left 01/22/2022   Procedure: LAPAROSCOPIC  ASSISTED OPEN LEFT INGUINAL HERNIA REPAIR W/ MESH;  Surgeon: Johnathan Hausen, MD;  Location: [facility];  Service: General;  Laterality: Left;   LITHOTRIPSY      History reviewed. No pertinent family history.  Social:  reports that he has been smoking cigarettes. He has been smoking an average of .25 packs per day. His smokeless tobacco use includes snuff. He reports that he does not currently use alcohol. He reports that he does not use drugs.  Allergies: No Known Allergies  Medications: Current Outpatient Medications  Medication Instructions   ibuprofen (ADVIL) 800 mg, Oral, Every 8 hours PRN   ibuprofen (ADVIL) 800 mg, Oral, Every 8 hours PRN   methadone (DOLOPHINE) 60 mg, Oral, Daily    ROS - all of the below systems have been reviewed with the patient and positives are indicated with bold text General: chills, fever or night sweats Eyes: blurry vision or double vision ENT: epistaxis or sore throat Allergy/Immunology: itchy/watery eyes or nasal congestion Hematologic/Lymphatic: bleeding problems, blood clots or swollen lymph nodes Endocrine: temperature intolerance or unexpected weight changes Breast: new or changing breast lumps or nipple discharge Resp: cough, shortness of breath, or wheezing CV: chest pain or dyspnea on exertion GI: as per HPI GU: dysuria, trouble voiding, or hematuria MSK: joint pain or joint stiffness Neuro: TIA or stroke symptoms Derm: pruritus and skin lesion  changes Psych: anxiety and depression  Objective   PE Blood pressure 130/80, pulse 66, temperature 98.3 F (36.8 C), temperature source Oral, resp. rate 16, height '5\' 10"'$  (1.778 m), weight 93.4 kg, SpO2 96 %. Constitutional: NAD; conversant; no deformities Eyes: Moist conjunctiva; no lid lag; anicteric; PERRL Neck: Trachea midline; no thyromegaly Lungs: Normal respiratory effort; no tactile fremitus CV: RRR; no palpable thrills; no pitting edema GI: Abd incarcerated left inguinal hernia, reducible right inguinal hernia, reducible umbilical hernia; no palpable hepatosplenomegaly MSK: Normal range of motion of extremities; no clubbing/cyanosis Psychiatric: Appropriate affect; alert and oriented x3 Lymphatic: No palpable cervical or axillary lymphadenopathy  No results found for this or any previous visit (from the past 24 hour(s)).  Imaging Orders  No imaging studies ordered today   CT Pelvis 07/04/22  Left inguinal hernia contains descending and proximal sigmoid colon. Negative for bowel obstruction or other complicating feature. Fat containing right inguinal hernia is also identified. Partial visualization of a horseshoe kidney.    Assessment and Plan   Travis Wilkins is an 57 y.o. male with a recurrent left inguinal hernia, initial right inguinal hernia, and initial reducible umbilical hernia (defect ~0.5 cm x 0.5 cm on exam).  He had a robotic TAP left inguinal hernia repair on 09/21/21, and an open left inguinal hernia repair on 01/22/22.   I recommended robotic bilateral inguinal hernia repair with mesh and open umbilical hernia repair.  We discussed the procedure, its risks, benefits and alternatives.  We discussed the risk related to the reoperative field and possible removal of previous  mesh.  After a full discussion and all questions answered, the patient granted consent to proceed.   Felicie Morn, MD  [facility] Use [email or web address] to contact on  call provider

## 2022-10-07 NOTE — Anesthesia Procedure Notes (Signed)
Procedure Name: Intubation Date/Time: 10/07/2022 10:36 AM  Performed by: Sharlette Dense, CRNAPre-anesthesia Checklist: Patient identified, Emergency Drugs available, Suction available and Patient being monitored Patient Re-evaluated:Patient Re-evaluated prior to induction Oxygen Delivery Method: Circle system utilized Preoxygenation: Pre-oxygenation with 100% oxygen Induction Type: IV induction Ventilation: Mask ventilation without difficulty and Oral airway inserted - appropriate to patient size Laryngoscope Size: Glidescope and 3 Grade View: Grade I Tube type: Parker flex tip Tube size: 7.5 mm Number of attempts: 1 Airway Equipment and Method: Stylet Placement Confirmation: ETT inserted through vocal cords under direct vision, positive ETCO2 and breath sounds checked- equal and bilateral Secured at: 22 cm Tube secured with: Tape Dental Injury: Teeth and Oropharynx as per pre-operative assessment  Difficulty Due To: Difficulty was anticipated and Difficult Airway- due to anterior larynx

## 2022-10-07 NOTE — Discharge Instructions (Signed)
 GROIN HERNIA REPAIR POST OPERATIVE INSTRUCTIONS  Thinking Clearly  The anesthesia may cause you to feel different for 1 or 2 days. Do not drive, drink alcohol, or make any big decisions for at least 2 days.  Nutrition When you wake up, you will be able to drink small amounts of liquid. If you do not feel sick, you can slowly advance your diet to regular foods. Continue to drink lots of fluids, usually about 8 to 10 glasses per day. Eat a high-fiber diet so you don't strain during bowel movements. High-Fiber Foods Foods high in fiber include beans, bran cereals and whole-grain breads, peas, dried fruit (figs, apricots, and dates), raspberries, blackberries, strawberries, sweet corn, broccoli, baked potatoes with skin, plums, pears, apples, greens, and nuts. Activity Slowly increase your activity. Be sure to get up and walk every hour or so to prevent blood clots. No heavy lifting or strenuous activity for 4 weeks following surgery to prevent hernias at your incision sites or recurrence of your hernia. It is normal to feel tired. You may need more sleep than usual.  Get your rest but make sure to get up and move around frequently to prevent blood clots and pneumonia.  Work and Return to School You can go back to work when you feel well enough. Discuss the timing with your surgeon. You can usually go back to school or work 1 week or less after an laparoscopic or an open repair. If your work requires heavy lifting or strenuous activity you need to be placed on light duty for 4 weeks following surgery. You can return to gym class, sports or other physical activities 4 weeks after surgery.  Wound Care You may experience significant bruising in the groin including into the scrotum in males.  Rest, elevating the groin and scrotum above the level of the heart, ice and compression with tight fitting underwear can help.  Always wash your hands before and after touching near your incision site. Do  not soak in a bathtub until cleared at your follow up appointment. You may take a shower 24 hours after surgery. A small amount of drainage from the incision is normal. If the drainage is thick and yellow or the site is red, you may have an infection, so call your surgeon. If you have a drain in one of your incisions, it will be taken out in office when the drainage stops. Steri-Strips will fall off in 7 to 10 days or they will be removed during your first office visit. If you have dermabond glue covering over the incision, allow the glue to flake off on its own. Protect the new skin, especially from the sun. The sun can burn and cause darker scarring. Your scar will heal in about 4 to 6 weeks and will become softer and continue to fade over the next year.  The cosmetic appearance of the incisions will improve over the course of the first year after surgery. Sensation around your incision will return in a few weeks or months.  Bowel Movements After intestinal surgery, you may have loose watery stools for several days. If watery diarrhea lasts longer than 3 days, contact your surgeon. Pain medication (narcotics) can cause constipation. Increase the fiber in your diet with high-fiber foods if you are constipated. You can take an over the counter stool softener like Colace to avoid constipation.  Additional over the counter medications can also be used if Colace isn't sufficient (for example, Milk of Magnesia or Miralax).    Pain The amount of pain is different for each person. Some people need only 1 to 3 doses of pain control medication, while others need more. Take alternating doses of tylenol and ibuprofen around the clock for the first five days following surgery.  This will provide a baseline of pain control and help with inflammation.  Take the narcotic pain medication in addition if needed for severe pain.  Contact Your Surgeon at [phone], if you have: Pain that will not go away Pain that  gets worse A fever of more than 101F (38.3C) Repeated vomiting Swelling, redness, bleeding, or bad-smelling drainage from your wound site Strong abdominal pain No bowel movement or unable to pass gas for 3 days Watery diarrhea lasting longer than 3 days  Pain Control The goal of pain control is to minimize pain, keep you moving and help you heal. Your surgical team will work with you on your pain plan. Most often a combination of therapies and medications are used to control your pain. You may also be given medication (local anesthetic) at the surgical site. This may help control your pain for several days. Extreme pain puts extra stress on your body at a time when your body needs to focus on healing. Do not wait until your pain has reached a level "10" or is unbearable before telling your doctor or nurse. It is much easier to control pain before it becomes severe. Following a laparoscopic procedure, pain is sometimes felt in the shoulder. This is due to the gas inserted into your abdomen during the procedure. Moving and walking helps to decrease the gas and the right shoulder pain.  Use the guide below for ways to manage your post-operative pain. Learn more by going to facs.org/safepaincontrol.  How Intense Is My Pain Common Therapies to Feel Better       I hardly notice my pain, and it does not interfere with my activities.  I notice my pain and it distracts me, but I can still do activities (sitting up, walking, standing).  Non-Medication Therapies  Ice (in a bag, applied over clothing at the surgical site), elevation, rest, meditation, massage, distraction (music, TV, play) walking and mild exercise Splinting the abdomen with pillows +  Non-Opioid Medications Acetaminophen (Tylenol) Non-steroidal anti-inflammatory drugs (NSAIDS) Aspirin, Ibuprofen (Motrin, Advil) Naproxen (Aleve) Take these as needed, when you feel pain. Both acetaminophen and NSAIDs help to decrease pain  and swelling (inflammation).      My pain is hard to ignore and is more noticeable even when I rest.  My pain interferes with my usual activities.  Non-Medication Therapies  +  Non-Opioid medications  Take on a regular schedule (around-the-clock) instead of as needed. (For example, Tylenol every 6 hours at 9:00 am, 3:00 pm, 9:00 pm, 3:00 am and Motrin every 6 hours at 12:00 am, 6:00 am, 12:00 pm, 6:00 pm)         I am focused on my pain, and I am not doing my daily activities.  I am groaning in pain, and I cannot sleep. I am unable to do anything.  My pain is as bad as it could be, and nothing else matters.  Non-Medication Therapies  +  Around-the-Clock Non-Opioid Medications  +  Short-acting opioids  Opioids should be used with other medications to manage severe pain. Opioids block pain and give a feeling of euphoria (feel high). Addiction, a serious side effect of opioids, is rare with short-term (a few days) use.  Examples of short-acting opioids   include: Tramadol (Ultram), Hydrocodone (Norco, Vicodin), Hydromorphone (Dilaudid), Oxycodone (Oxycontin)     The above directions have been adapted from the American College of Surgeons Surgical Patient Education Program.  Please refer to the ACS website if needed: [email or web address]://[email or web address]/-/media/[email or web address]   Travis Batte, MD [facility] [address], [address], [place], [place]  [address] ?  [address] [address], [place], [place]   [address] [phone] ? [phone] ? FAX [phone] Web site: [email or web address]  

## 2022-10-07 NOTE — Anesthesia Preprocedure Evaluation (Addendum)
Anesthesia Evaluation  Patient identified by MRN, date of birth, ID band Patient awake    Reviewed: Allergy & Precautions, NPO status , Patient's Chart, lab work & pertinent test results  History of Anesthesia Complications Negative for: history of anesthetic complications  Airway Mallampati: II  TM Distance: <3 FB Neck ROM: Full    Dental  (+) Dental Advisory Given   Pulmonary COPD, Current Smoker and Patient abstained from smoking.   breath sounds clear to auscultation       Cardiovascular negative cardio ROS  Rhythm:Regular Rate:Normal     Neuro/Psych  Headaches    GI/Hepatic ,GERD  Controlled,,(+)     substance abuse (methadone for h/o opioid disuse)    Endo/Other  negative endocrine ROS    Renal/GU negative Renal ROS     Musculoskeletal   Abdominal   Peds  Hematology negative hematology ROS (+)   Anesthesia Other Findings   Reproductive/Obstetrics                             Anesthesia Physical Anesthesia Plan  ASA: 3  Anesthesia Plan: General   Post-op Pain Management: Tylenol PO (pre-op)*   Induction: Intravenous  PONV Risk Score and Plan: 1 and Ondansetron  Airway Management Planned: Oral ETT and Video Laryngoscope Planned  Additional Equipment: None  Intra-op Plan:   Post-operative Plan: Extubation in OR  Informed Consent: I have reviewed the patients History and Physical, chart, labs and discussed the procedure including the risks, benefits and alternatives for the proposed anesthesia with the patient or authorized representative who has indicated his/her understanding and acceptance.     Dental advisory given  Plan Discussed with: CRNA and Surgeon  Anesthesia Plan Comments:         Anesthesia Quick Evaluation

## 2022-10-07 NOTE — Transfer of Care (Signed)
Immediate Anesthesia Transfer of Care Note  Patient: Travis Wilkins  Procedure(s) Performed: XI ROBOTIC ASSISTED INCARCERATED LEFT RECURRENT  INGUINAL HERNIA REPAIR WITH MESH AND REDUCIBLE RIGHT INGUINAL HERNIA WITH MESH (Bilateral: Abdomen) OPEN HERNIA REPAIR UMBILICAL ADULT (Abdomen)  Patient Location: PACU  Anesthesia Type:General  Level of Consciousness: drowsy  Airway & Oxygen Therapy: Patient Spontanous Breathing and Patient connected to face mask oxygen  Post-op Assessment: Report given to RN and Post -op Vital signs reviewed and stable  Post vital signs: Reviewed and stable  Last Vitals:  Vitals Value Taken Time  BP 129/70 10/07/22 1330  Temp 36.8 C 10/07/22 1330  Pulse 96 10/07/22 1332  Resp 9 10/07/22 1332  SpO2 97 % 10/07/22 1332  Vitals shown include unvalidated device data.  Last Pain:  Vitals:   10/07/22 0905  TempSrc: Oral         Complications:  Encounter Notable Events  Notable Event Outcome Phase Comment  Difficult to intubate - expected  Intraprocedure Filed from anesthesia note documentation.

## 2022-10-07 NOTE — Op Note (Signed)
Patient: Travis Wilkins (08/31/1965, [id number])  Date of Surgery: 10/07/2022   Preoperative Diagnosis: RECURRENT INCARCERATED LEFT INGUINAL, RIGHT INITIAL REDUCIBLE INGUNAL AND REDUCIBLE UMBILICAL (A999333 X 0.5 CM BASED ON EXAM) HERNIAS   Postoperative Diagnosis:  RECURRENT INCARCERATED LEFT INGUINAL, RIGHT INITIAL REDUCIBLE INGUNAL AND REDUCIBLE UMBILICAL (A999333 X 0.5 CM BASED ON EXAM) HERNIAS   Surgical Procedure: XI ROBOTIC ASSISTED INCARCERATED LEFT RECURRENT  INGUINAL HERNIA REPAIR WITH MESH AND REDUCIBLE RIGHT INGUINAL HERNIA WITH MESH:  OPEN HERNIA REPAIR UMBILICAL ADULT:    Operative Team Members:  Surgeon(s) and Role:    * Arial Galligan, Nickola Major, MD - Primary   [place]  Anesthesiologist: Annye Asa, MD CRNA: Deliah Boston, CRNA; Sharlette Dense, CRNA   Anesthesia: General   Fluids:  Total I/O In: -  Out: 125 [Urine:75; [id number]  Complications: None  Drains:  None  Specimen: None  Disposition:  PACU - hemodynamically stable.  Plan of Care: Discharge to home after PACU  Indications for Procedure:  Travis Wilkins is an 57 y.o. male with a recurrent left inguinal hernia, initial right inguinal hernia, and initial reducible umbilical hernia (defect ~0.5 cm x 0.5 cm on exam).  He had a robotic TAP left inguinal hernia repair on 09/21/21, and an open left inguinal hernia repair on 01/22/22.    I recommended robotic bilateral inguinal hernia repair with mesh and open umbilical hernia repair.  We discussed the procedure, its risks, benefits and alternatives.  We discussed the risk related to the reoperative field and possible removal of previous mesh.  After a full discussion and all questions answered, the patient granted consent to proceed.   Findings:  Technique: Robotic DaVinci Xi Assisted Transabdominal preperitoneal (TAPP) Hernia Location: Left indirect inguinal hernia containing incarcerated sigmoid colon, indirect  reducible right inguinal hernia, reducible umbilical hernia Mesh Size &Type:  Bard 3D max extra- large right inguinal mesh,  Bard 3D max extra- large right inguinal mesh, no mesh used for small umbilical defect Mesh Fixation: ethibond suture to Coopers ligament and the upper midline aspect of both meshes.  Vicryl mesh placed on lateral aspect of left inguinal hernia.  Infection status: Patient: Private Patient Elective Case Case: Elective Infection Present At Time Of Surgery (PATOS): None   Description of Procedure:  The patient was positioned supine, padded and secured to the bed.  The abdomen was widely prepped and draped.  A time out procedure was performed.  A 1 cm supraumbilical incision was made.  The umbilical stalk was freed from the fascia using electrocautery and the umbilical hernia defect was defined by clearing the fatty tissue off of the fascial defect.  The preperitoneal fat was reduced back in the abdomen and a 5 mm trocar was placed in this position the abdomen was insufflated to 15 mmHg.  Three 8 mm trocars were placed higher in the abdomen for the robotic instruments.  I used the previous scars for these trocars.  The abdomen was inspected.  There was a indirect hernia defect on the left containing the sigmoid colon.  There was a indirect inguinal hernia on the right with no contents.  The [employer] robotic system was docked and we proceeded with the robotic portion of the case.  There was an INDIRECT hernia on the RIGHT.  Utilizing a transabdominal pre peritoneal technique (TAPP), a horizontal incision was made in the peritoneum, immediately below the umbilicus.  Dissection was carried out in the pre peritoneal space  down to the level of the hernia sac which was reduced into the peritoneal cavity completely.  The cord contents were parietalized and preserved.  A large pre peritoneal dissection was performed to uncover the direct, indirect, femoral and obturator spaces.  Cooper's  ligament was uncovered medially and the psoas muscle uncovered laterally.  The mesh, as documented above, was opened and advanced into the pre peritoneal position so that it more than adequately covered the indirect, direct, femoral and obturator spaces.  The mesh laid flat, with no inferior folds and covered the entire myopectineal orifice.  The mesh was fixated with 0 Ethibond suture to Cooper's ligament and superior medially.  The peritoneal flap was closed using running 2-0 V-Loc suture at the conclusion of the left-sided repair.     There was an INDIRECT recurrent hernia on the LEFT containing the sigmoid colon.  Utilizing a transabdominal pre peritoneal technique (TAPP), a horizontal incision was made in the peritoneum, immediately below the umbilicus.  Dissection was carried out in the pre peritoneal space down to the level of the hernia sac which was reduced into the peritoneal cavity completely.  This dissection was complicated by the previous preperitoneal mesh.  I were carefully dissect this free of the abdominal wall.  An injury was made to the inferior epigastric artery so the vascular bundle was ligated proximally and distally to this injury.  The cord contents were parietalized and preserved.  A large pre peritoneal dissection was performed to uncover the direct, indirect, femoral and obturator spaces.  Cooper's ligament was uncovered medially and the psoas muscle uncovered laterally.  It appeared the previous mesh had folded up on itself, possibly when the abdomen was desufflated at the conclusion of the case allowing the hernia to recur posterior/inferior to the mesh.  Thankfully, this protected the vital structures posterior to the inguinal canal from dense scar tissue, allowing that portion of the case to proceed in the a typical fashion.  The mesh, as documented above, was opened and advanced into the pre peritoneal position so that it more than adequately covered the indirect, direct,  femoral and obturator spaces.  The mesh laid flat, with no inferior folds and covered the entire myopectineal orifice.  The mesh was fixated with 0 Ethibond suture to Cooper's ligament and superior medially.  One 2-0 Vicryl suture was placed laterally through the mesh to secure it to the abdominal wall and prevent folding or migration of the mesh while it scars in place.  The peritoneal flap was closed using running 2-0 V-Loc suture.  There were few peritoneal defects on the right and left which were closed using 2 running 2 oh V-Loc sutures.  The umbilical trocar was removed and the fascial defect was closed with a figure of eight 0 Novafil suture.  The umbilical stalk was fixed to the abdominal wall using Vicryl suture.  The peritoneal cavity was completely desufflated, the trocars removed and the skin closed with 4-0 Monocryl subcuticular suture and skin glue.  All sponge and needle counts were correct at the end of the case.  Louanna Raw, MD General, Bariatric, & Minimally Invasive Surgery [facility] [place]

## 2022-10-07 NOTE — Anesthesia Postprocedure Evaluation (Signed)
Anesthesia Post Note  Patient: Travis Wilkins  Procedure(s) Performed: XI ROBOTIC ASSISTED INCARCERATED LEFT RECURRENT  INGUINAL HERNIA REPAIR WITH MESH AND REDUCIBLE RIGHT INGUINAL HERNIA WITH MESH (Bilateral: Abdomen) OPEN HERNIA REPAIR UMBILICAL ADULT (Abdomen)     Patient location during evaluation: PACU Anesthesia Type: General Level of consciousness: awake and alert, patient cooperative and oriented Pain management: pain level controlled Vital Signs Assessment: post-procedure vital signs reviewed and stable Respiratory status: spontaneous breathing, nonlabored ventilation and respiratory function stable Cardiovascular status: blood pressure returned to baseline and stable Postop Assessment: no apparent nausea or vomiting Anesthetic complications: yes   Encounter Notable Events  Notable Event Outcome Phase Comment  Difficult to intubate - expected  Intraprocedure Filed from anesthesia note documentation.    Last Vitals:  Vitals:   10/07/22 1400 10/07/22 1415  BP: 123/74 117/79  Pulse: 86 83  Resp: 12 12  Temp:    SpO2: 94% 94%    Last Pain:  Vitals:   10/07/22 1426  TempSrc:   PainSc: 5                  Baruc Tugwell,E. Luxe Cuadros

## 2022-10-08 DIAGNOSIS — K4091 Unilateral inguinal hernia, without obstruction or gangrene, recurrent: Secondary | ICD-10-CM | POA: Diagnosis not present

## 2022-10-09 ENCOUNTER — Encounter (HOSPITAL_COMMUNITY): Payer: Self-pay | Admitting: Surgery

## 2023-04-14 IMAGING — DX DG OR LOCAL ABDOMEN
1 series · 1 of 1 positions shown · non-contrast
Comparison: December 19, 2005.

CLINICAL DATA: Possible retained sponge.

EXAM:
OR LOCAL ABDOMEN

[abdomen kub]
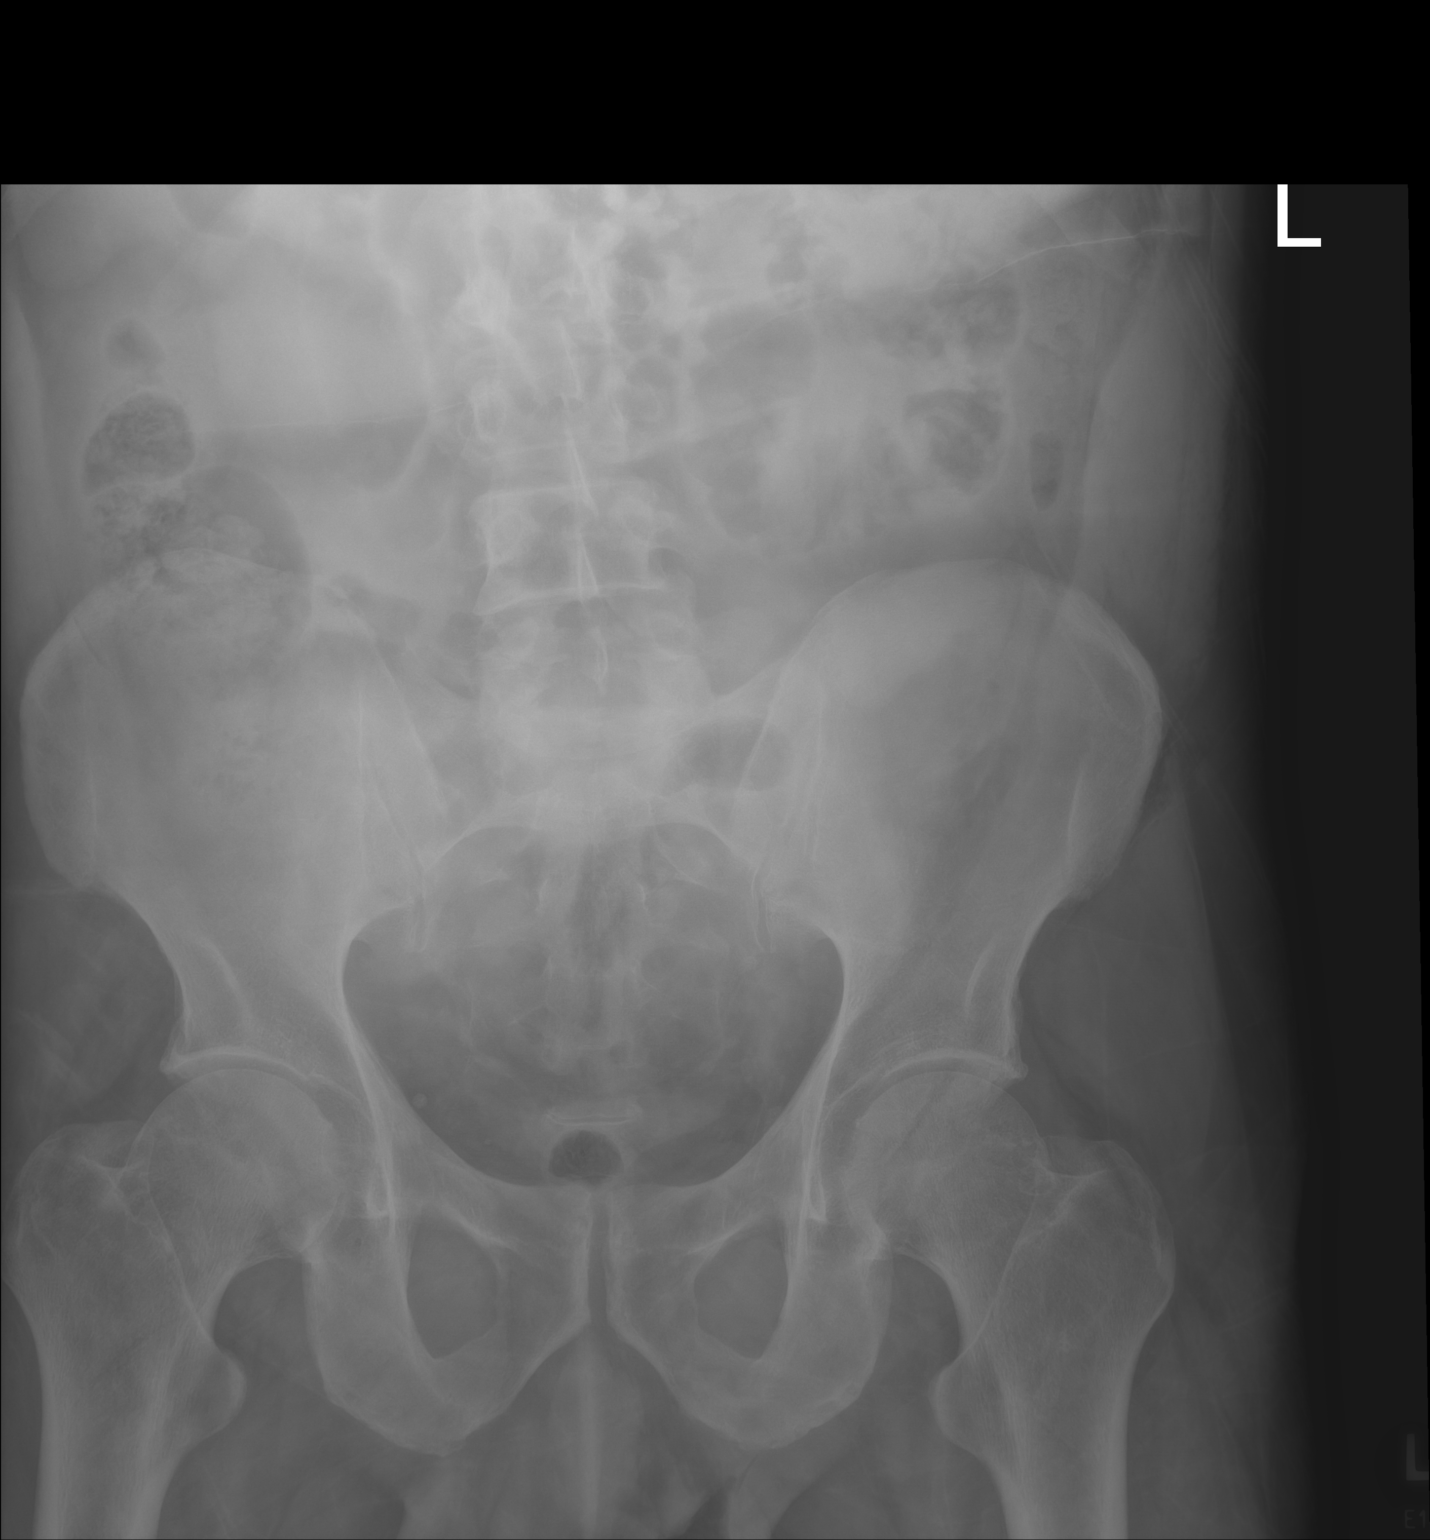

[1 of 1 positions shown; findings below may reference images not displayed]

FINDINGS: The bowel gas pattern is normal. No radio-opaque calculi or other
significant radiographic abnormality are seen. No radiopaque foreign
body is noted.
IMPRESSION: No radiopaque foreign body is noted.
